# Patient Record
Sex: Female | Born: 1970 | Race: White | Hispanic: No | Marital: Married | Smoking: Never smoker
Health system: Southern US, Community
[De-identification: ages and names within clinical notes are randomized; demographics above are authoritative.]

## PROBLEM LIST (undated history)

## (undated) ENCOUNTER — Emergency Department (HOSPITAL_COMMUNITY): Payer: Self-pay

## (undated) DIAGNOSIS — I10 Essential (primary) hypertension: Secondary | ICD-10-CM

## (undated) DIAGNOSIS — G43909 Migraine, unspecified, not intractable, without status migrainosus: Secondary | ICD-10-CM

## (undated) DIAGNOSIS — G610 Guillain-Barre syndrome: Secondary | ICD-10-CM

## (undated) DIAGNOSIS — D569 Thalassemia, unspecified: Secondary | ICD-10-CM

## (undated) DIAGNOSIS — I429 Cardiomyopathy, unspecified: Secondary | ICD-10-CM

## (undated) DIAGNOSIS — D649 Anemia, unspecified: Secondary | ICD-10-CM

## (undated) HISTORY — PX: REDUCTION MAMMAPLASTY: SUR839

## (undated) HISTORY — PX: BREAST REDUCTION SURGERY: SHX8

## (undated) HISTORY — PX: TUBAL LIGATION: SHX77

## (undated) HISTORY — PX: ABDOMINAL HYSTERECTOMY: SHX81

## (undated) HISTORY — DX: Cardiomyopathy, unspecified: I42.9

---

## 2009-05-30 ENCOUNTER — Ambulatory Visit (HOSPITAL_COMMUNITY): Admission: RE | Admit: 2009-05-30 | Discharge: 2009-05-30 | Payer: Self-pay | Admitting: Specialist

## 2009-11-28 ENCOUNTER — Ambulatory Visit (HOSPITAL_BASED_OUTPATIENT_CLINIC_OR_DEPARTMENT_OTHER): Admission: RE | Admit: 2009-11-28 | Discharge: 2009-11-29 | Payer: Self-pay | Admitting: Specialist

## 2010-05-04 LAB — BASIC METABOLIC PANEL
CO2: 25 mEq/L (ref 19–32)
Calcium: 8.7 mg/dL (ref 8.4–10.5)
Creatinine, Ser: 0.81 mg/dL (ref 0.4–1.2)
GFR calc Af Amer: >60 mL/min (ref >60)
GFR calc non Af Amer: >60 mL/min (ref >60)
Glucose, Bld: 85 mg/dL (ref 70–99)
Sodium: 134 mEq/L — ABNORMAL LOW (ref 135–145)

## 2010-05-04 LAB — CBC
Hemoglobin: 7.8 g/dL — ABNORMAL LOW (ref 12.0–15.0)
MCH: 15.9 pg — ABNORMAL LOW (ref 26.0–34.0)
MCHC: 26.7 g/dL — ABNORMAL LOW (ref 30.0–36.0)
Platelets: 333 10*3/uL (ref 150–400)

## 2010-05-04 LAB — DIFFERENTIAL
Basophils Absolute: 0.1 10*3/uL (ref 0.0–0.1)
Eosinophils Absolute: 0.1 10*3/uL (ref 0.0–0.7)
Eosinophils Relative: 1 % (ref 0–5)
Lymphs Abs: 2.9 10*3/uL (ref 0.7–4.0)
Monocytes Absolute: 0.4 10*3/uL (ref 0.1–1.0)
Neutrophils Relative %: 43 % (ref 43–77)

## 2010-05-04 LAB — POCT HEMOGLOBIN-HEMACUE: Hemoglobin: 9.6 g/dL — ABNORMAL LOW (ref 12.0–15.0)

## 2010-05-10 LAB — POCT HEMOGLOBIN-HEMACUE: Hemoglobin: 7.6 g/dL — ABNORMAL LOW (ref 12.0–15.0)

## 2011-06-15 ENCOUNTER — Encounter (HOSPITAL_COMMUNITY): Payer: Self-pay | Admitting: Emergency Medicine

## 2011-06-15 ENCOUNTER — Emergency Department (HOSPITAL_COMMUNITY)
Admission: EM | Admit: 2011-06-15 | Discharge: 2011-06-15 | Disposition: A | Discharge status: Home / Self Care | Payer: No Typology Code available for payment source | Attending: Emergency Medicine | Admitting: Emergency Medicine

## 2011-06-15 DIAGNOSIS — S139XXA Sprain of joints and ligaments of unspecified parts of neck, initial encounter: Secondary | ICD-10-CM | POA: Insufficient documentation

## 2011-06-15 DIAGNOSIS — G44209 Tension-type headache, unspecified, not intractable: Secondary | ICD-10-CM

## 2011-06-15 DIAGNOSIS — M542 Cervicalgia: Secondary | ICD-10-CM | POA: Insufficient documentation

## 2011-06-15 DIAGNOSIS — S161XXA Strain of muscle, fascia and tendon at neck level, initial encounter: Secondary | ICD-10-CM

## 2011-06-15 HISTORY — DX: Anemia, unspecified: D64.9

## 2011-06-15 MED ORDER — CYCLOBENZAPRINE HCL 10 MG PO TABS: 10.0000 mg | ORAL_TABLET | Freq: Three times a day (TID) | ORAL | Status: AC | PRN

## 2011-06-15 MED ORDER — TRAMADOL HCL 50 MG PO TABS: 50.0000 mg | ORAL_TABLET | Freq: Four times a day (QID) | ORAL | Status: AC | PRN

## 2011-06-15 MED ORDER — IBUPROFEN 800 MG PO TABS
800.0000 mg | ORAL_TABLET | Freq: Once | ORAL | Status: AC
  Administered 2011-06-15: 800 mg via ORAL
  Filled 2011-06-15: qty 1

## 2011-06-15 MED ORDER — NAPROXEN 500 MG PO TABS: 500.0000 mg | ORAL_TABLET | Freq: Two times a day (BID) | ORAL | Status: AC

## 2011-06-15 MED ORDER — OXYCODONE-ACETAMINOPHEN 5-325 MG PO TABS
2.0000 | ORAL_TABLET | Freq: Once | ORAL | Status: AC
  Administered 2011-06-15: 2 via ORAL
  Filled 2011-06-15: qty 2
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
## 2011-06-15 NOTE — Discharge Instructions (Signed)
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
## 2013-03-25 ENCOUNTER — Emergency Department (HOSPITAL_BASED_OUTPATIENT_CLINIC_OR_DEPARTMENT_OTHER): Admission: EM | Admit: 2013-03-25 | Payer: BC Managed Care – PPO | Source: Home / Self Care

## 2016-03-01 DIAGNOSIS — Z1231 Encounter for screening mammogram for malignant neoplasm of breast: Secondary | ICD-10-CM | POA: Diagnosis not present

## 2016-03-01 DIAGNOSIS — H538 Other visual disturbances: Secondary | ICD-10-CM | POA: Diagnosis not present

## 2016-03-01 DIAGNOSIS — I10 Essential (primary) hypertension: Secondary | ICD-10-CM | POA: Diagnosis not present

## 2016-03-01 DIAGNOSIS — Z01118 Encounter for examination of ears and hearing with other abnormal findings: Secondary | ICD-10-CM | POA: Diagnosis not present

## 2016-03-01 DIAGNOSIS — Z113 Encounter for screening for infections with a predominantly sexual mode of transmission: Secondary | ICD-10-CM | POA: Diagnosis not present

## 2016-03-01 DIAGNOSIS — Z131 Encounter for screening for diabetes mellitus: Secondary | ICD-10-CM | POA: Diagnosis not present

## 2016-03-01 DIAGNOSIS — Z136 Encounter for screening for cardiovascular disorders: Secondary | ICD-10-CM | POA: Diagnosis not present

## 2016-03-01 DIAGNOSIS — Z0000 Encounter for general adult medical examination without abnormal findings: Secondary | ICD-10-CM | POA: Diagnosis not present

## 2016-03-01 DIAGNOSIS — B009 Herpesviral infection, unspecified: Secondary | ICD-10-CM | POA: Diagnosis not present

## 2016-03-15 DIAGNOSIS — I429 Cardiomyopathy, unspecified: Secondary | ICD-10-CM | POA: Diagnosis not present

## 2016-03-15 DIAGNOSIS — G43119 Migraine with aura, intractable, without status migrainosus: Secondary | ICD-10-CM | POA: Diagnosis not present

## 2016-03-15 DIAGNOSIS — Z1151 Encounter for screening for human papillomavirus (HPV): Secondary | ICD-10-CM | POA: Diagnosis not present

## 2016-03-15 DIAGNOSIS — R635 Abnormal weight gain: Secondary | ICD-10-CM | POA: Diagnosis not present

## 2016-03-15 DIAGNOSIS — R1031 Right lower quadrant pain: Secondary | ICD-10-CM | POA: Diagnosis not present

## 2016-03-15 DIAGNOSIS — Z01419 Encounter for gynecological examination (general) (routine) without abnormal findings: Secondary | ICD-10-CM | POA: Diagnosis not present

## 2016-03-15 DIAGNOSIS — Z124 Encounter for screening for malignant neoplasm of cervix: Secondary | ICD-10-CM | POA: Diagnosis not present

## 2016-03-15 DIAGNOSIS — Z6835 Body mass index (BMI) 35.0-35.9, adult: Secondary | ICD-10-CM | POA: Diagnosis not present

## 2016-03-25 DIAGNOSIS — J40 Bronchitis, not specified as acute or chronic: Secondary | ICD-10-CM | POA: Diagnosis not present

## 2016-03-25 DIAGNOSIS — Z6841 Body Mass Index (BMI) 40.0 and over, adult: Secondary | ICD-10-CM | POA: Diagnosis not present

## 2016-04-05 DIAGNOSIS — Z6841 Body Mass Index (BMI) 40.0 and over, adult: Secondary | ICD-10-CM | POA: Diagnosis not present

## 2016-04-05 DIAGNOSIS — Z90710 Acquired absence of both cervix and uterus: Secondary | ICD-10-CM | POA: Diagnosis not present

## 2016-04-05 DIAGNOSIS — R1031 Right lower quadrant pain: Secondary | ICD-10-CM | POA: Diagnosis not present

## 2016-04-05 DIAGNOSIS — R102 Pelvic and perineal pain: Secondary | ICD-10-CM | POA: Diagnosis not present

## 2016-04-06 DIAGNOSIS — Z6841 Body Mass Index (BMI) 40.0 and over, adult: Secondary | ICD-10-CM | POA: Diagnosis not present

## 2016-04-06 DIAGNOSIS — J209 Acute bronchitis, unspecified: Secondary | ICD-10-CM | POA: Diagnosis not present

## 2016-04-23 DIAGNOSIS — G43119 Migraine with aura, intractable, without status migrainosus: Secondary | ICD-10-CM | POA: Diagnosis not present

## 2016-04-23 DIAGNOSIS — I10 Essential (primary) hypertension: Secondary | ICD-10-CM | POA: Diagnosis not present

## 2016-04-23 DIAGNOSIS — I429 Cardiomyopathy, unspecified: Secondary | ICD-10-CM | POA: Diagnosis not present

## 2016-04-23 DIAGNOSIS — R05 Cough: Secondary | ICD-10-CM | POA: Diagnosis not present

## 2016-07-08 DIAGNOSIS — Z880 Allergy status to penicillin: Secondary | ICD-10-CM | POA: Diagnosis not present

## 2016-07-08 DIAGNOSIS — M79601 Pain in right arm: Secondary | ICD-10-CM | POA: Diagnosis not present

## 2016-07-08 DIAGNOSIS — M7591 Shoulder lesion, unspecified, right shoulder: Secondary | ICD-10-CM | POA: Diagnosis not present

## 2016-07-08 DIAGNOSIS — M25511 Pain in right shoulder: Secondary | ICD-10-CM | POA: Diagnosis not present

## 2016-07-08 DIAGNOSIS — M779 Enthesopathy, unspecified: Secondary | ICD-10-CM | POA: Diagnosis not present

## 2016-07-08 DIAGNOSIS — Z882 Allergy status to sulfonamides status: Secondary | ICD-10-CM | POA: Diagnosis not present

## 2016-08-30 DIAGNOSIS — I429 Cardiomyopathy, unspecified: Secondary | ICD-10-CM | POA: Diagnosis not present

## 2016-08-30 DIAGNOSIS — I10 Essential (primary) hypertension: Secondary | ICD-10-CM | POA: Diagnosis not present

## 2016-08-30 DIAGNOSIS — D649 Anemia, unspecified: Secondary | ICD-10-CM | POA: Diagnosis not present

## 2016-08-30 DIAGNOSIS — G43119 Migraine with aura, intractable, without status migrainosus: Secondary | ICD-10-CM | POA: Diagnosis not present

## 2016-11-05 DIAGNOSIS — J0141 Acute recurrent pansinusitis: Secondary | ICD-10-CM | POA: Diagnosis not present

## 2016-11-05 DIAGNOSIS — R079 Chest pain, unspecified: Secondary | ICD-10-CM | POA: Diagnosis not present

## 2016-11-05 DIAGNOSIS — J40 Bronchitis, not specified as acute or chronic: Secondary | ICD-10-CM | POA: Diagnosis not present

## 2016-11-05 DIAGNOSIS — J060 Acute laryngopharyngitis: Secondary | ICD-10-CM | POA: Diagnosis not present

## 2016-11-28 DIAGNOSIS — R05 Cough: Secondary | ICD-10-CM | POA: Diagnosis not present

## 2016-11-28 DIAGNOSIS — I429 Cardiomyopathy, unspecified: Secondary | ICD-10-CM | POA: Diagnosis not present

## 2016-11-28 DIAGNOSIS — I10 Essential (primary) hypertension: Secondary | ICD-10-CM | POA: Diagnosis not present

## 2016-11-28 DIAGNOSIS — D649 Anemia, unspecified: Secondary | ICD-10-CM | POA: Diagnosis not present

## 2017-01-07 DIAGNOSIS — F439 Reaction to severe stress, unspecified: Secondary | ICD-10-CM | POA: Diagnosis not present

## 2017-01-07 DIAGNOSIS — D649 Anemia, unspecified: Secondary | ICD-10-CM | POA: Diagnosis not present

## 2017-01-07 DIAGNOSIS — I10 Essential (primary) hypertension: Secondary | ICD-10-CM | POA: Diagnosis not present

## 2017-01-07 DIAGNOSIS — G43119 Migraine with aura, intractable, without status migrainosus: Secondary | ICD-10-CM | POA: Diagnosis not present

## 2017-01-21 DIAGNOSIS — I10 Essential (primary) hypertension: Secondary | ICD-10-CM | POA: Diagnosis not present

## 2017-01-21 DIAGNOSIS — D649 Anemia, unspecified: Secondary | ICD-10-CM | POA: Diagnosis not present

## 2017-01-21 DIAGNOSIS — R202 Paresthesia of skin: Secondary | ICD-10-CM | POA: Diagnosis not present

## 2017-01-21 DIAGNOSIS — F439 Reaction to severe stress, unspecified: Secondary | ICD-10-CM | POA: Diagnosis not present

## 2017-02-02 DIAGNOSIS — R202 Paresthesia of skin: Secondary | ICD-10-CM | POA: Diagnosis not present

## 2017-02-02 DIAGNOSIS — R51 Headache: Secondary | ICD-10-CM | POA: Diagnosis not present

## 2017-02-02 DIAGNOSIS — F4323 Adjustment disorder with mixed anxiety and depressed mood: Secondary | ICD-10-CM | POA: Diagnosis not present

## 2017-03-04 DIAGNOSIS — G43909 Migraine, unspecified, not intractable, without status migrainosus: Secondary | ICD-10-CM | POA: Diagnosis not present

## 2017-03-04 DIAGNOSIS — R202 Paresthesia of skin: Secondary | ICD-10-CM | POA: Diagnosis not present

## 2017-03-04 DIAGNOSIS — Z136 Encounter for screening for cardiovascular disorders: Secondary | ICD-10-CM | POA: Diagnosis not present

## 2017-03-04 DIAGNOSIS — D649 Anemia, unspecified: Secondary | ICD-10-CM | POA: Diagnosis not present

## 2017-03-04 DIAGNOSIS — Z131 Encounter for screening for diabetes mellitus: Secondary | ICD-10-CM | POA: Diagnosis not present

## 2017-03-04 DIAGNOSIS — I10 Essential (primary) hypertension: Secondary | ICD-10-CM | POA: Diagnosis not present

## 2017-03-04 DIAGNOSIS — Z01118 Encounter for examination of ears and hearing with other abnormal findings: Secondary | ICD-10-CM | POA: Diagnosis not present

## 2017-03-04 DIAGNOSIS — Z0000 Encounter for general adult medical examination without abnormal findings: Secondary | ICD-10-CM | POA: Diagnosis not present

## 2017-03-04 DIAGNOSIS — N390 Urinary tract infection, site not specified: Secondary | ICD-10-CM | POA: Diagnosis not present

## 2017-03-28 DIAGNOSIS — I10 Essential (primary) hypertension: Secondary | ICD-10-CM | POA: Diagnosis not present

## 2017-03-28 DIAGNOSIS — G43909 Migraine, unspecified, not intractable, without status migrainosus: Secondary | ICD-10-CM | POA: Diagnosis not present

## 2017-03-28 DIAGNOSIS — D649 Anemia, unspecified: Secondary | ICD-10-CM | POA: Diagnosis not present

## 2017-03-28 DIAGNOSIS — R202 Paresthesia of skin: Secondary | ICD-10-CM | POA: Diagnosis not present

## 2017-04-04 DIAGNOSIS — D649 Anemia, unspecified: Secondary | ICD-10-CM | POA: Diagnosis not present

## 2017-04-04 DIAGNOSIS — I10 Essential (primary) hypertension: Secondary | ICD-10-CM | POA: Diagnosis not present

## 2017-04-04 DIAGNOSIS — Z124 Encounter for screening for malignant neoplasm of cervix: Secondary | ICD-10-CM | POA: Diagnosis not present

## 2017-04-04 DIAGNOSIS — Z1231 Encounter for screening mammogram for malignant neoplasm of breast: Secondary | ICD-10-CM | POA: Diagnosis not present

## 2017-04-04 DIAGNOSIS — R300 Dysuria: Secondary | ICD-10-CM | POA: Diagnosis not present

## 2017-04-04 DIAGNOSIS — G43909 Migraine, unspecified, not intractable, without status migrainosus: Secondary | ICD-10-CM | POA: Diagnosis not present

## 2017-04-12 DIAGNOSIS — R0609 Other forms of dyspnea: Secondary | ICD-10-CM | POA: Diagnosis not present

## 2017-04-12 DIAGNOSIS — I10 Essential (primary) hypertension: Secondary | ICD-10-CM | POA: Diagnosis not present

## 2017-04-12 DIAGNOSIS — R9431 Abnormal electrocardiogram [ECG] [EKG]: Secondary | ICD-10-CM | POA: Diagnosis not present

## 2017-04-25 DIAGNOSIS — D649 Anemia, unspecified: Secondary | ICD-10-CM | POA: Diagnosis not present

## 2017-04-25 DIAGNOSIS — I10 Essential (primary) hypertension: Secondary | ICD-10-CM | POA: Diagnosis not present

## 2017-04-25 DIAGNOSIS — R202 Paresthesia of skin: Secondary | ICD-10-CM | POA: Diagnosis not present

## 2017-04-25 DIAGNOSIS — G43909 Migraine, unspecified, not intractable, without status migrainosus: Secondary | ICD-10-CM | POA: Diagnosis not present

## 2017-05-21 DIAGNOSIS — F419 Anxiety disorder, unspecified: Secondary | ICD-10-CM | POA: Diagnosis not present

## 2017-05-21 DIAGNOSIS — G43909 Migraine, unspecified, not intractable, without status migrainosus: Secondary | ICD-10-CM | POA: Diagnosis not present

## 2017-05-21 DIAGNOSIS — I10 Essential (primary) hypertension: Secondary | ICD-10-CM | POA: Diagnosis not present

## 2017-05-21 DIAGNOSIS — D649 Anemia, unspecified: Secondary | ICD-10-CM | POA: Diagnosis not present

## 2017-08-15 DIAGNOSIS — Z113 Encounter for screening for infections with a predominantly sexual mode of transmission: Secondary | ICD-10-CM | POA: Diagnosis not present

## 2017-08-15 DIAGNOSIS — B009 Herpesviral infection, unspecified: Secondary | ICD-10-CM | POA: Diagnosis not present

## 2017-08-15 DIAGNOSIS — Z131 Encounter for screening for diabetes mellitus: Secondary | ICD-10-CM | POA: Diagnosis not present

## 2017-08-15 DIAGNOSIS — Z136 Encounter for screening for cardiovascular disorders: Secondary | ICD-10-CM | POA: Diagnosis not present

## 2017-08-15 DIAGNOSIS — Z0000 Encounter for general adult medical examination without abnormal findings: Secondary | ICD-10-CM | POA: Diagnosis not present

## 2017-08-15 DIAGNOSIS — D649 Anemia, unspecified: Secondary | ICD-10-CM | POA: Diagnosis not present

## 2017-08-15 DIAGNOSIS — Z0110 Encounter for examination of ears and hearing without abnormal findings: Secondary | ICD-10-CM | POA: Diagnosis not present

## 2017-08-15 DIAGNOSIS — G43909 Migraine, unspecified, not intractable, without status migrainosus: Secondary | ICD-10-CM | POA: Diagnosis not present

## 2017-08-15 DIAGNOSIS — I10 Essential (primary) hypertension: Secondary | ICD-10-CM | POA: Diagnosis not present

## 2017-08-15 DIAGNOSIS — F419 Anxiety disorder, unspecified: Secondary | ICD-10-CM | POA: Diagnosis not present

## 2017-09-02 DIAGNOSIS — F419 Anxiety disorder, unspecified: Secondary | ICD-10-CM | POA: Diagnosis not present

## 2017-09-02 DIAGNOSIS — I10 Essential (primary) hypertension: Secondary | ICD-10-CM | POA: Diagnosis not present

## 2017-09-02 DIAGNOSIS — D649 Anemia, unspecified: Secondary | ICD-10-CM | POA: Diagnosis not present

## 2017-09-02 DIAGNOSIS — G43909 Migraine, unspecified, not intractable, without status migrainosus: Secondary | ICD-10-CM | POA: Diagnosis not present

## 2017-10-09 ENCOUNTER — Emergency Department (HOSPITAL_BASED_OUTPATIENT_CLINIC_OR_DEPARTMENT_OTHER): Payer: BLUE CROSS/BLUE SHIELD

## 2017-10-09 ENCOUNTER — Emergency Department (HOSPITAL_BASED_OUTPATIENT_CLINIC_OR_DEPARTMENT_OTHER)
Admission: EM | Admit: 2017-10-09 | Discharge: 2017-10-09 | Disposition: A | Discharge status: Home / Self Care | Payer: BLUE CROSS/BLUE SHIELD | Attending: Emergency Medicine | Admitting: Emergency Medicine

## 2017-10-09 ENCOUNTER — Other Ambulatory Visit: Payer: Self-pay

## 2017-10-09 ENCOUNTER — Encounter (HOSPITAL_BASED_OUTPATIENT_CLINIC_OR_DEPARTMENT_OTHER): Payer: Self-pay

## 2017-10-09 DIAGNOSIS — R0600 Dyspnea, unspecified: Secondary | ICD-10-CM | POA: Diagnosis not present

## 2017-10-09 DIAGNOSIS — R072 Precordial pain: Secondary | ICD-10-CM | POA: Diagnosis not present

## 2017-10-09 DIAGNOSIS — R0602 Shortness of breath: Secondary | ICD-10-CM | POA: Diagnosis not present

## 2017-10-09 DIAGNOSIS — R61 Generalized hyperhidrosis: Secondary | ICD-10-CM | POA: Insufficient documentation

## 2017-10-09 DIAGNOSIS — I10 Essential (primary) hypertension: Secondary | ICD-10-CM | POA: Diagnosis not present

## 2017-10-09 DIAGNOSIS — R079 Chest pain, unspecified: Secondary | ICD-10-CM | POA: Diagnosis not present

## 2017-10-09 HISTORY — DX: Thalassemia, unspecified: D56.9

## 2017-10-09 HISTORY — DX: Essential (primary) hypertension: I10

## 2017-10-09 LAB — BASIC METABOLIC PANEL
ANION GAP: 9 (ref 5–15)
BUN: 8 mg/dL (ref 6–20)
CALCIUM: 9.1 mg/dL (ref 8.9–10.3)
CO2: 25 mmol/L (ref 22–32)
CREATININE: 1.06 mg/dL — AB (ref 0.44–1.00)
Chloride: 104 mmol/L (ref 98–111)
GFR calc non Af Amer: >60 mL/min (ref >60)
GLUCOSE: 87 mg/dL (ref 70–99)
Potassium: 3.1 mmol/L — ABNORMAL LOW (ref 3.5–5.1)
Sodium: 138 mmol/L (ref 135–145)

## 2017-10-09 LAB — CBG MONITORING, ED: Glucose-Capillary: 87 mg/dL (ref 70–99)

## 2017-10-09 LAB — TROPONIN I
Troponin I: <0.03 ng/mL (ref <0.03)
Troponin I: <0.03 ng/mL (ref <0.03)

## 2017-10-09 LAB — CBC
HCT: 40 % (ref 36.0–46.0)
Hemoglobin: 12.8 g/dL (ref 12.0–15.0)
MCH: 23.8 pg — ABNORMAL LOW (ref 26.0–34.0)
MCHC: 32 g/dL (ref 30.0–36.0)
MCV: 74.3 fL — AB (ref 78.0–100.0)
Platelets: 236 10*3/uL (ref 150–400)
RBC: 5.38 MIL/uL — ABNORMAL HIGH (ref 3.87–5.11)
RDW: 15.6 % — AB (ref 11.5–15.5)
WBC: 6 10*3/uL (ref 4.0–10.5)

## 2017-10-09 LAB — PREGNANCY, URINE: Preg Test, Ur: NEGATIVE

## 2017-10-09 LAB — D-DIMER, QUANTITATIVE: D-Dimer, Quant: <0.27 ug/mL-FEU (ref 0.00–0.50)

## 2017-10-09 MED ORDER — ASPIRIN 81 MG PO CHEW
324.0000 mg | CHEWABLE_TABLET | Freq: Once | ORAL | Status: DC
Start: 2017-10-09 — End: 2017-10-09

## 2017-10-09 MED ORDER — ASPIRIN 81 MG PO CHEW
324.0000 mg | CHEWABLE_TABLET | Freq: Once | ORAL | Status: AC
  Administered 2017-10-09: 324 mg via ORAL
  Filled 2017-10-09: qty 4

## 2017-10-09 NOTE — ED Notes (Signed)
Pt describes chest pain as intermittent "twinges" of pain to the L side of sternum and states in went down L arm. Pain is reproducible to palpation. Pt endorses that she has had some associated ShOB.

## 2017-10-09 NOTE — ED Provider Notes (Signed)
Emergency Department Provider Note   I have reviewed the triage vital signs and the nursing notes.   HISTORY  Chief Complaint Chest Pain   HPI Kaitlyn Ashley is a 47 y.o. female with a history of thalassemia and reported history of hypertension but on medications and not hypertensive here the presents the emergency department today secondary to chest pain.  Patient has had intermittent chest pain for the last 24 to 36 hours.  Not related to effort but does have dyspnea on exertion during this time as well.  She also states she has dyspnea associated with the chest pain.  No nausea or vomiting.  Has had a couple episodes of diaphoresis but is not consistent with the chest pain.  She is tried antacids which did not seem to make her symptoms better.  She has not had any trauma with anything heavy recently.  No recent cough or fever.  No pleuritic component.  Does seem to radiate to her left shoulder little bit.  Sometimes is worse with palpation symptoms is not. No other associated or modifying symptoms.    Past Medical History:  Diagnosis Date  . Anemia   . Hypertension   . Thalassemia     There are no active problems to display for this patient.   Past Surgical History:  Procedure Laterality Date  . ABDOMINAL HYSTERECTOMY    . BREAST REDUCTION SURGERY    . CESAREAN SECTION      Current Outpatient Rx  . Order #: [id number] Class: Historical Med  . Order #: [id number] Class: Historical Med  . Order #: [id number] Class: Historical Med    Allergies Penicillins and Sulfa antibiotics  No family history on file.  Social History Social History   Tobacco Use  . Smoking status: Never Smoker  . Smokeless tobacco: Never Used  Substance Use Topics  . Alcohol use: Not Currently  . Drug use: Never    Review of Systems  All other systems negative except as documented in the HPI. All pertinent positives and negatives as reviewed in the  HPI. ____________________________________________   PHYSICAL EXAM:  VITAL SIGNS: ED Triage Vitals  Enc Vitals Group     BP 10/09/17 1711 125/86     Pulse Rate 10/09/17 1711 95     Resp 10/09/17 1711 20     Temp 10/09/17 1711 99 F (37.2 C)     Temp Source 10/09/17 1711 Oral     SpO2 10/09/17 1711 100 %     Weight 10/09/17 1711 223 lb 9.6 oz (101.4 kg)     Height 10/09/17 1711 5\' 4"  (1.626 m)     Head Circumference --      Peak Flow --      Pain Score 10/09/17 1717 8     Pain Loc --      Pain Edu? --      Excl. in GC? --     Constitutional: Alert and oriented. Well appearing and in no acute distress. Eyes: Conjunctivae are normal. PERRL. EOMI. Head: Atraumatic. Nose: No congestion/rhinnorhea. Mouth/Throat: Mucous membranes are moist.  Oropharynx non-erythematous. Neck: No stridor.  No meningeal signs.   Cardiovascular: Normal rate, regular rhythm. Good peripheral circulation. Grossly normal heart sounds.   Respiratory: Normal respiratory effort.  No retractions. Lungs CTAB. Gastrointestinal: Soft and nontender. No distention.  Musculoskeletal: No lower extremity tenderness nor edema. Does have some ttp to left costochondral joint. No gross deformities of extremities. Neurologic:  Normal speech and language. No gross focal neurologic deficits  are appreciated.  Skin:  Skin is warm, dry and intact. No rash noted.   ____________________________________________   LABS (all labs ordered are listed, but only abnormal results are displayed)  Labs Reviewed  BASIC METABOLIC PANEL - Abnormal; Notable for the following components:      Result Value   Potassium 3.1 (*)    Creatinine, Ser 1.06 (*)    All other components within normal limits  CBC - Abnormal; Notable for the following components:   RBC 5.38 (*)    MCV 74.3 (*)    MCH 23.8 (*)    RDW 15.6 (*)    All other components within normal limits  TROPONIN I  TROPONIN I  PREGNANCY, URINE  D-DIMER, QUANTITATIVE (NOT  [facility])  CBG MONITORING, ED   ____________________________________________  EKG   EKG Interpretation  Date/Time:  Wednesday October 09 2017 17:18:33 EDT Ventricular Rate:  71 PR Interval:  162 QRS Duration: 84 QT Interval:  398 QTC Calculation: 432 R Axis:   48 Text Interpretation:  Normal sinus rhythm Possible Anterior infarct , age undetermined Abnormal ECG No old tracing to compare Confirmed by Marily MemosMesner, Acey Woodfield [phone]([id number]) on 10/09/2017 5:21:55 PM       ____________________________________________  RADIOLOGY  Dg Chest 2 View  Result Date: 10/09/2017 CLINICAL DATA:  Chest pain and shortness of breath EXAM: CHEST - 2 VIEW COMPARISON:  November 05, 2016 FINDINGS: There is no edema or consolidation. The heart size and pulmonary vascularity are normal. No adenopathy. No pneumothorax. No bone lesions. IMPRESSION: No edema or consolidation. Electronically Signed   By: Bretta BangWilliam  Woodruff III M.D.   On: 10/09/2017 19:23    ____________________________________________   PROCEDURES  Procedure(s) performed:   Procedures   ____________________________________________   INITIAL IMPRESSION / ASSESSMENT AND PLAN / ED COURSE  eval for pe/acs. Seems somewhat MSK but doesn't explain the exertional dyspnea unless she also has anemia with it?  HEART score of 1. Workup unremarkable. Delta troponins negative.d dimer negative. Possibly MSK? CP free currently. Stable for dc.    Pertinent labs & imaging results that were available during my care of the patient were reviewed by me and considered in my medical decision making (see chart for details).  ____________________________________________  FINAL CLINICAL IMPRESSION(S) / ED DIAGNOSES  Final diagnoses:  Nonspecific chest pain     MEDICATIONS GIVEN DURING THIS VISIT:  Medications  aspirin chewable tablet 324 mg (324 mg Oral Given 10/09/17 1804)     NEW OUTPATIENT MEDICATIONS STARTED DURING THIS VISIT:  Discharge Medication  List as of 10/09/2017  9:14 PM      Note:  This note was prepared with assistance of Dragon voice recognition software. Occasional wrong-word or sound-a-like substitutions may have occurred due to the inherent limitations of voice recognition software.   Marily MemosMesner, Breindel Collier, MD 10/09/17 [phone]

## 2017-10-09 NOTE — ED Triage Notes (Signed)
C/o CP x 2 days-NAD-steady gait 

## 2018-03-03 DIAGNOSIS — Z1231 Encounter for screening mammogram for malignant neoplasm of breast: Secondary | ICD-10-CM | POA: Diagnosis not present

## 2018-03-03 DIAGNOSIS — Z1239 Encounter for other screening for malignant neoplasm of breast: Secondary | ICD-10-CM | POA: Diagnosis not present

## 2018-03-04 DIAGNOSIS — G43909 Migraine, unspecified, not intractable, without status migrainosus: Secondary | ICD-10-CM | POA: Diagnosis not present

## 2018-03-04 DIAGNOSIS — F419 Anxiety disorder, unspecified: Secondary | ICD-10-CM | POA: Diagnosis not present

## 2018-03-04 DIAGNOSIS — D649 Anemia, unspecified: Secondary | ICD-10-CM | POA: Diagnosis not present

## 2018-03-04 DIAGNOSIS — I10 Essential (primary) hypertension: Secondary | ICD-10-CM | POA: Diagnosis not present

## 2018-03-25 DIAGNOSIS — Z01419 Encounter for gynecological examination (general) (routine) without abnormal findings: Secondary | ICD-10-CM | POA: Diagnosis not present

## 2018-03-25 DIAGNOSIS — R102 Pelvic and perineal pain: Secondary | ICD-10-CM | POA: Diagnosis not present

## 2018-03-25 DIAGNOSIS — Z90710 Acquired absence of both cervix and uterus: Secondary | ICD-10-CM | POA: Diagnosis not present

## 2018-03-25 DIAGNOSIS — R1031 Right lower quadrant pain: Secondary | ICD-10-CM | POA: Diagnosis not present

## 2018-03-25 DIAGNOSIS — Z90711 Acquired absence of uterus with remaining cervical stump: Secondary | ICD-10-CM | POA: Diagnosis not present

## 2018-03-25 DIAGNOSIS — N83291 Other ovarian cyst, right side: Secondary | ICD-10-CM | POA: Diagnosis not present

## 2018-05-06 DIAGNOSIS — F419 Anxiety disorder, unspecified: Secondary | ICD-10-CM | POA: Diagnosis not present

## 2018-05-06 DIAGNOSIS — I10 Essential (primary) hypertension: Secondary | ICD-10-CM | POA: Diagnosis not present

## 2018-05-06 DIAGNOSIS — D649 Anemia, unspecified: Secondary | ICD-10-CM | POA: Diagnosis not present

## 2018-05-06 DIAGNOSIS — G43909 Migraine, unspecified, not intractable, without status migrainosus: Secondary | ICD-10-CM | POA: Diagnosis not present

## 2018-06-17 DIAGNOSIS — J40 Bronchitis, not specified as acute or chronic: Secondary | ICD-10-CM | POA: Diagnosis not present

## 2018-06-24 DIAGNOSIS — F419 Anxiety disorder, unspecified: Secondary | ICD-10-CM | POA: Diagnosis not present

## 2018-06-24 DIAGNOSIS — G43909 Migraine, unspecified, not intractable, without status migrainosus: Secondary | ICD-10-CM | POA: Diagnosis not present

## 2018-06-24 DIAGNOSIS — D649 Anemia, unspecified: Secondary | ICD-10-CM | POA: Diagnosis not present

## 2018-06-24 DIAGNOSIS — I10 Essential (primary) hypertension: Secondary | ICD-10-CM | POA: Diagnosis not present

## 2018-07-30 DIAGNOSIS — R1031 Right lower quadrant pain: Secondary | ICD-10-CM | POA: Diagnosis not present

## 2018-07-30 DIAGNOSIS — N83291 Other ovarian cyst, right side: Secondary | ICD-10-CM | POA: Diagnosis not present

## 2018-07-30 DIAGNOSIS — Z90710 Acquired absence of both cervix and uterus: Secondary | ICD-10-CM | POA: Diagnosis not present

## 2018-07-30 DIAGNOSIS — N83201 Unspecified ovarian cyst, right side: Secondary | ICD-10-CM | POA: Diagnosis not present

## 2018-08-05 DIAGNOSIS — I10 Essential (primary) hypertension: Secondary | ICD-10-CM | POA: Diagnosis not present

## 2018-08-05 DIAGNOSIS — G43909 Migraine, unspecified, not intractable, without status migrainosus: Secondary | ICD-10-CM | POA: Diagnosis not present

## 2018-08-05 DIAGNOSIS — F419 Anxiety disorder, unspecified: Secondary | ICD-10-CM | POA: Diagnosis not present

## 2018-08-05 DIAGNOSIS — D649 Anemia, unspecified: Secondary | ICD-10-CM | POA: Diagnosis not present

## 2018-09-02 DIAGNOSIS — Z01818 Encounter for other preprocedural examination: Secondary | ICD-10-CM | POA: Diagnosis not present

## 2018-09-02 DIAGNOSIS — N83201 Unspecified ovarian cyst, right side: Secondary | ICD-10-CM | POA: Diagnosis not present

## 2018-09-02 DIAGNOSIS — R1031 Right lower quadrant pain: Secondary | ICD-10-CM | POA: Diagnosis not present

## 2018-09-05 DIAGNOSIS — Z1159 Encounter for screening for other viral diseases: Secondary | ICD-10-CM | POA: Diagnosis not present

## 2018-09-05 DIAGNOSIS — Z01812 Encounter for preprocedural laboratory examination: Secondary | ICD-10-CM | POA: Diagnosis not present

## 2018-09-05 DIAGNOSIS — R1031 Right lower quadrant pain: Secondary | ICD-10-CM | POA: Diagnosis not present

## 2018-09-05 DIAGNOSIS — Z20828 Contact with and (suspected) exposure to other viral communicable diseases: Secondary | ICD-10-CM | POA: Diagnosis not present

## 2018-09-05 DIAGNOSIS — N83201 Unspecified ovarian cyst, right side: Secondary | ICD-10-CM | POA: Diagnosis not present

## 2018-09-12 DIAGNOSIS — N83201 Unspecified ovarian cyst, right side: Secondary | ICD-10-CM | POA: Diagnosis not present

## 2018-09-12 DIAGNOSIS — Z90711 Acquired absence of uterus with remaining cervical stump: Secondary | ICD-10-CM | POA: Diagnosis not present

## 2018-09-12 DIAGNOSIS — R1031 Right lower quadrant pain: Secondary | ICD-10-CM | POA: Diagnosis not present

## 2018-09-12 DIAGNOSIS — N736 Female pelvic peritoneal adhesions (postinfective): Secondary | ICD-10-CM | POA: Diagnosis not present

## 2018-09-13 DIAGNOSIS — N736 Female pelvic peritoneal adhesions (postinfective): Secondary | ICD-10-CM | POA: Diagnosis not present

## 2018-09-13 DIAGNOSIS — N83201 Unspecified ovarian cyst, right side: Secondary | ICD-10-CM | POA: Diagnosis not present

## 2018-10-15 DIAGNOSIS — G43909 Migraine, unspecified, not intractable, without status migrainosus: Secondary | ICD-10-CM | POA: Diagnosis not present

## 2018-10-16 DIAGNOSIS — I10 Essential (primary) hypertension: Secondary | ICD-10-CM | POA: Diagnosis not present

## 2018-10-16 DIAGNOSIS — F419 Anxiety disorder, unspecified: Secondary | ICD-10-CM | POA: Diagnosis not present

## 2018-10-16 DIAGNOSIS — D649 Anemia, unspecified: Secondary | ICD-10-CM | POA: Diagnosis not present

## 2018-10-16 DIAGNOSIS — G43909 Migraine, unspecified, not intractable, without status migrainosus: Secondary | ICD-10-CM | POA: Diagnosis not present

## 2018-12-29 ENCOUNTER — Other Ambulatory Visit: Payer: Self-pay | Admitting: Physician Assistant

## 2018-12-29 DIAGNOSIS — M25512 Pain in left shoulder: Secondary | ICD-10-CM | POA: Diagnosis not present

## 2018-12-29 DIAGNOSIS — G43909 Migraine, unspecified, not intractable, without status migrainosus: Secondary | ICD-10-CM | POA: Diagnosis not present

## 2018-12-29 DIAGNOSIS — N644 Mastodynia: Secondary | ICD-10-CM | POA: Diagnosis not present

## 2018-12-29 DIAGNOSIS — F419 Anxiety disorder, unspecified: Secondary | ICD-10-CM | POA: Diagnosis not present

## 2019-01-05 ENCOUNTER — Other Ambulatory Visit: Payer: Self-pay

## 2019-01-05 ENCOUNTER — Ambulatory Visit
Admission: RE | Admit: 2019-01-05 | Discharge: 2019-01-05 | Disposition: A | Discharge status: Home / Self Care | Payer: BLUE CROSS/BLUE SHIELD | Source: Ambulatory Visit | Attending: Physician Assistant | Admitting: Physician Assistant

## 2019-01-05 ENCOUNTER — Other Ambulatory Visit: Payer: Self-pay | Admitting: Physician Assistant

## 2019-01-05 ENCOUNTER — Ambulatory Visit
Admission: RE | Admit: 2019-01-05 | Discharge: 2019-01-05 | Disposition: A | Discharge status: Home / Self Care | Payer: BC Managed Care – PPO | Source: Ambulatory Visit | Attending: Physician Assistant | Admitting: Physician Assistant

## 2019-01-05 DIAGNOSIS — N6320 Unspecified lump in the left breast, unspecified quadrant: Secondary | ICD-10-CM | POA: Diagnosis not present

## 2019-01-05 DIAGNOSIS — N644 Mastodynia: Secondary | ICD-10-CM

## 2019-01-05 DIAGNOSIS — R928 Other abnormal and inconclusive findings on diagnostic imaging of breast: Secondary | ICD-10-CM | POA: Diagnosis not present

## 2019-01-05 DIAGNOSIS — N6489 Other specified disorders of breast: Secondary | ICD-10-CM | POA: Diagnosis not present

## 2019-03-19 DIAGNOSIS — I10 Essential (primary) hypertension: Secondary | ICD-10-CM | POA: Diagnosis not present

## 2019-03-19 DIAGNOSIS — G43909 Migraine, unspecified, not intractable, without status migrainosus: Secondary | ICD-10-CM | POA: Diagnosis not present

## 2019-03-19 DIAGNOSIS — F419 Anxiety disorder, unspecified: Secondary | ICD-10-CM | POA: Diagnosis not present

## 2019-03-19 DIAGNOSIS — D649 Anemia, unspecified: Secondary | ICD-10-CM | POA: Diagnosis not present

## 2019-04-02 DIAGNOSIS — Z03818 Encounter for observation for suspected exposure to other biological agents ruled out: Secondary | ICD-10-CM | POA: Diagnosis not present

## 2019-04-02 DIAGNOSIS — Z20828 Contact with and (suspected) exposure to other viral communicable diseases: Secondary | ICD-10-CM | POA: Diagnosis not present

## 2019-04-02 DIAGNOSIS — R030 Elevated blood-pressure reading, without diagnosis of hypertension: Secondary | ICD-10-CM | POA: Diagnosis not present

## 2019-04-02 DIAGNOSIS — R05 Cough: Secondary | ICD-10-CM | POA: Diagnosis not present

## 2019-04-30 DIAGNOSIS — G43909 Migraine, unspecified, not intractable, without status migrainosus: Secondary | ICD-10-CM | POA: Diagnosis not present

## 2019-04-30 DIAGNOSIS — F419 Anxiety disorder, unspecified: Secondary | ICD-10-CM | POA: Diagnosis not present

## 2019-04-30 DIAGNOSIS — I10 Essential (primary) hypertension: Secondary | ICD-10-CM | POA: Diagnosis not present

## 2019-04-30 DIAGNOSIS — J208 Acute bronchitis due to other specified organisms: Secondary | ICD-10-CM | POA: Diagnosis not present

## 2019-05-16 DIAGNOSIS — Z131 Encounter for screening for diabetes mellitus: Secondary | ICD-10-CM | POA: Diagnosis not present

## 2019-05-16 DIAGNOSIS — R0602 Shortness of breath: Secondary | ICD-10-CM | POA: Diagnosis not present

## 2019-05-16 DIAGNOSIS — R5383 Other fatigue: Secondary | ICD-10-CM | POA: Diagnosis not present

## 2019-05-16 DIAGNOSIS — Z79899 Other long term (current) drug therapy: Secondary | ICD-10-CM | POA: Diagnosis not present

## 2019-05-29 DIAGNOSIS — Z23 Encounter for immunization: Secondary | ICD-10-CM | POA: Diagnosis not present

## 2019-06-07 ENCOUNTER — Encounter (HOSPITAL_BASED_OUTPATIENT_CLINIC_OR_DEPARTMENT_OTHER): Payer: Self-pay | Admitting: *Deleted

## 2019-06-07 ENCOUNTER — Inpatient Hospital Stay (HOSPITAL_BASED_OUTPATIENT_CLINIC_OR_DEPARTMENT_OTHER)
Admission: EM | Admit: 2019-06-07 | Discharge: 2019-06-13 | DRG: 095 | Disposition: A | Discharge status: Home / Self Care | Payer: BC Managed Care – PPO | Attending: Internal Medicine | Admitting: Internal Medicine

## 2019-06-07 ENCOUNTER — Emergency Department (HOSPITAL_BASED_OUTPATIENT_CLINIC_OR_DEPARTMENT_OTHER): Payer: BC Managed Care – PPO

## 2019-06-07 ENCOUNTER — Other Ambulatory Visit: Payer: Self-pay

## 2019-06-07 DIAGNOSIS — E876 Hypokalemia: Secondary | ICD-10-CM | POA: Diagnosis not present

## 2019-06-07 DIAGNOSIS — D569 Thalassemia, unspecified: Secondary | ICD-10-CM | POA: Diagnosis present

## 2019-06-07 DIAGNOSIS — G610 Guillain-Barre syndrome: Principal | ICD-10-CM | POA: Diagnosis present

## 2019-06-07 DIAGNOSIS — M7989 Other specified soft tissue disorders: Secondary | ICD-10-CM | POA: Diagnosis not present

## 2019-06-07 DIAGNOSIS — Z6841 Body Mass Index (BMI) 40.0 and over, adult: Secondary | ICD-10-CM

## 2019-06-07 DIAGNOSIS — Z90710 Acquired absence of both cervix and uterus: Secondary | ICD-10-CM | POA: Diagnosis not present

## 2019-06-07 DIAGNOSIS — M79605 Pain in left leg: Secondary | ICD-10-CM | POA: Diagnosis not present

## 2019-06-07 DIAGNOSIS — Z91040 Latex allergy status: Secondary | ICD-10-CM | POA: Diagnosis not present

## 2019-06-07 DIAGNOSIS — Z8249 Family history of ischemic heart disease and other diseases of the circulatory system: Secondary | ICD-10-CM | POA: Diagnosis not present

## 2019-06-07 DIAGNOSIS — Z880 Allergy status to penicillin: Secondary | ICD-10-CM

## 2019-06-07 DIAGNOSIS — R200 Anesthesia of skin: Secondary | ICD-10-CM | POA: Diagnosis not present

## 2019-06-07 DIAGNOSIS — T50B95A Adverse effect of other viral vaccines, initial encounter: Secondary | ICD-10-CM | POA: Diagnosis present

## 2019-06-07 DIAGNOSIS — Z20822 Contact with and (suspected) exposure to covid-19: Secondary | ICD-10-CM | POA: Diagnosis present

## 2019-06-07 DIAGNOSIS — Z20828 Contact with and (suspected) exposure to other viral communicable diseases: Secondary | ICD-10-CM | POA: Diagnosis not present

## 2019-06-07 DIAGNOSIS — R202 Paresthesia of skin: Secondary | ICD-10-CM | POA: Diagnosis not present

## 2019-06-07 DIAGNOSIS — Z882 Allergy status to sulfonamides status: Secondary | ICD-10-CM

## 2019-06-07 DIAGNOSIS — G43909 Migraine, unspecified, not intractable, without status migrainosus: Secondary | ICD-10-CM | POA: Diagnosis not present

## 2019-06-07 DIAGNOSIS — M4802 Spinal stenosis, cervical region: Secondary | ICD-10-CM | POA: Diagnosis present

## 2019-06-07 DIAGNOSIS — Z9049 Acquired absence of other specified parts of digestive tract: Secondary | ICD-10-CM | POA: Diagnosis not present

## 2019-06-07 DIAGNOSIS — I10 Essential (primary) hypertension: Secondary | ICD-10-CM | POA: Diagnosis present

## 2019-06-07 DIAGNOSIS — M50222 Other cervical disc displacement at C5-C6 level: Secondary | ICD-10-CM | POA: Diagnosis not present

## 2019-06-07 DIAGNOSIS — M50221 Other cervical disc displacement at C4-C5 level: Secondary | ICD-10-CM | POA: Diagnosis not present

## 2019-06-07 DIAGNOSIS — M79604 Pain in right leg: Secondary | ICD-10-CM

## 2019-06-07 DIAGNOSIS — Z03818 Encounter for observation for suspected exposure to other biological agents ruled out: Secondary | ICD-10-CM | POA: Diagnosis not present

## 2019-06-07 DIAGNOSIS — M48061 Spinal stenosis, lumbar region without neurogenic claudication: Secondary | ICD-10-CM | POA: Diagnosis not present

## 2019-06-07 DIAGNOSIS — R519 Headache, unspecified: Secondary | ICD-10-CM | POA: Diagnosis not present

## 2019-06-07 HISTORY — DX: Morbid (severe) obesity due to excess calories: E66.01

## 2019-06-07 HISTORY — DX: Migraine, unspecified, not intractable, without status migrainosus: G43.909

## 2019-06-07 LAB — CBC
HCT: 45.9 % (ref 36.0–46.0)
Hemoglobin: 14.2 g/dL (ref 12.0–15.0)
MCH: 24.4 pg — ABNORMAL LOW (ref 26.0–34.0)
MCHC: 30.9 g/dL (ref 30.0–36.0)
MCV: 79 fL — ABNORMAL LOW (ref 80.0–100.0)
Platelets: 230 10*3/uL (ref 150–400)
RBC: 5.81 MIL/uL — ABNORMAL HIGH (ref 3.87–5.11)
RDW: 14.2 % (ref 11.5–15.5)
WBC: 5.7 10*3/uL (ref 4.0–10.5)
nRBC: 0 % (ref 0.0–0.2)

## 2019-06-07 LAB — URINALYSIS, ROUTINE W REFLEX MICROSCOPIC
Bilirubin Urine: NEGATIVE
Glucose, UA: NEGATIVE mg/dL
Hgb urine dipstick: NEGATIVE
Ketones, ur: NEGATIVE mg/dL
Leukocytes,Ua: NEGATIVE
Nitrite: NEGATIVE
Protein, ur: NEGATIVE mg/dL
Specific Gravity, Urine: 1.025 (ref 1.005–1.030)
pH: 5.5 (ref 5.0–8.0)

## 2019-06-07 LAB — BASIC METABOLIC PANEL
Anion gap: 10 (ref 5–15)
BUN: 12 mg/dL (ref 6–20)
CO2: 27 mmol/L (ref 22–32)
Calcium: 9 mg/dL (ref 8.9–10.3)
Chloride: 101 mmol/L (ref 98–111)
Creatinine, Ser: 1.05 mg/dL — ABNORMAL HIGH (ref 0.44–1.00)
GFR calc Af Amer: >60 mL/min (ref >60)
GFR calc non Af Amer: >60 mL/min (ref >60)
Glucose, Bld: 95 mg/dL (ref 70–99)
Potassium: 3.2 mmol/L — ABNORMAL LOW (ref 3.5–5.1)
Sodium: 138 mmol/L (ref 135–145)

## 2019-06-07 LAB — SEDIMENTATION RATE: Sed Rate: 18 mm/hr (ref 0–22)

## 2019-06-07 LAB — MAGNESIUM: Magnesium: 2.1 mg/dL (ref 1.7–2.4)

## 2019-06-07 MED ORDER — GADOBUTROL 1 MMOL/ML IV SOLN
7.5000 mL | Freq: Once | INTRAVENOUS | Status: AC | PRN
  Administered 2019-06-07: 7.5 mL via INTRAVENOUS

## 2019-06-07 MED ORDER — POTASSIUM CHLORIDE CRYS ER 20 MEQ PO TBCR
40.0000 meq | EXTENDED_RELEASE_TABLET | Freq: Once | ORAL | Status: AC
  Administered 2019-06-07: 40 meq via ORAL
  Filled 2019-06-07: qty 2

## 2019-06-07 NOTE — ED Notes (Signed)
Pt transported to MRI 

## 2019-06-07 NOTE — ED Notes (Signed)
Patient transported to CT 

## 2019-06-07 NOTE — ED Notes (Signed)
Pt returned from MRI °

## 2019-06-07 NOTE — ED Notes (Signed)
Spoke with Kaitlyn Ashley at [facility] regarding consult with neuro-hospitalist

## 2019-06-07 NOTE — ED Notes (Signed)
Sprite provided to pt 

## 2019-06-07 NOTE — ED Provider Notes (Signed)
[facility] EMERGENCY DEPARTMENT Provider Note   CSN: [id number] Arrival date & time: 06/07/19  1324     History Chief Complaint  Patient presents with  . Numbness    Kaitlyn Ashley is a 49 y.o. female.  Kaitlyn Ashley is a 49 y.o. female with a history of hypertension, anemia and thalassemia, who presents to the emergency department for evaluation of leg pain, shaking and tingling.  She states that on Monday when she woke up in the morning to get ready for work she noticed that her legs were painful and throbbing from the knee down to the foot.  She reports that she also noticed that her legs felt shaky when she tried to get up and go to the bathroom.  And she started to have a tingling pins and needle sensation.  She reports the symptoms have been persistent throughout the week.  She has been able to walk and has not noticed weakness in her legs, but the discomfort and tingling has kept her from doing everything she would typically do during the week.  She has tried ibuprofen, Tylenol, Aleve, and gabapentin for this pain without relief.  She states on Friday she started to notice similar pain and tingling starting in her left hand and going up towards the elbow, but no similar symptoms in the right arm.  She states that a few years ago she had some tingling in the right foot that they thought was neuropathy, which resolved, aside from this she has never had any similar symptoms.  Is not diabetic.  States that she has had some mild midthoracic back pain, but denies any trauma or injury to the back.  No loss of bowel or bladder control or saddle anesthesia.  Has chronic migraines which are unchanged but no new or different headaches, no visual changes.  No fevers or chills.  States she was initially seen at urgent care and had a negative rapid Covid test, but was sent to the emergency department for further evaluation.        Past Medical History:  Diagnosis Date  . Anemia   .  Hypertension   . Thalassemia     There are no problems to display for this patient.   Past Surgical History:  Procedure Laterality Date  . ABDOMINAL HYSTERECTOMY    . BREAST REDUCTION SURGERY    . CESAREAN SECTION       OB History   No obstetric history on file.     No family history on file.  Social History   Tobacco Use  . Smoking status: Never Smoker  . Smokeless tobacco: Never Used  Substance Use Topics  . Alcohol use: Not Currently  . Drug use: Never    Home Medications Prior to Admission medications   Medication Sig Start Date End Date Taking? Authorizing Provider  acetaminophen (TYLENOL) 500 MG tablet Take 1,000 mg by mouth every 6 (six) hours as needed. For pain.    [provider]  BEE POLLEN PO Take 1 tablet by mouth every other day.    [provider]  RASPBERRY PO Take 2 tablets by mouth every other day.    [provider]    Allergies    Latex, Penicillins, and Sulfa antibiotics  Review of Systems   Review of Systems  Constitutional: Negative for chills and fever.  Respiratory: Negative for cough and shortness of breath.   Cardiovascular: Negative for chest pain.  Gastrointestinal: Negative for abdominal pain,  nausea and vomiting.  Genitourinary: Negative for dysuria and frequency.  Musculoskeletal: Positive for arthralgias, back pain and myalgias. Negative for joint swelling and neck pain.  Skin: Negative for color change and rash.  Neurological: Positive for numbness. Negative for dizziness, seizures, syncope, speech difficulty, weakness, light-headedness and headaches.       Paresthesias    Physical Exam Updated Vital Signs BP 137/86 (BP Location: Right Arm)   Pulse 86   Temp 98.5 F (36.9 C) (Oral)   Resp 18   Ht 5' 4"  (1.626 m)   Wt 112.5 kg   SpO2 100%   BMI 42.57 kg/m   Physical Exam Vitals and nursing note reviewed.  Constitutional:      General: She is not in acute distress.    Appearance: Normal  appearance. She is well-developed. She is obese. She is not ill-appearing or diaphoretic.     Comments: Well-appearing and in no distress.  HENT:     Head: Normocephalic and atraumatic.  Eyes:     General:        Right eye: No discharge.        Left eye: No discharge.  Cardiovascular:     Rate and Rhythm: Normal rate and regular rhythm.     Heart sounds: Normal heart sounds. No murmur. No friction rub. No gallop.   Pulmonary:     Effort: Pulmonary effort is normal. No respiratory distress.     Breath sounds: Normal breath sounds. No wheezing or rales.     Comments: Respirations equal and unlabored, patient able to speak in full sentences, lungs clear to auscultation bilaterally Abdominal:     General: Bowel sounds are normal. There is no distension.     Palpations: Abdomen is soft. There is no mass.     Tenderness: There is no abdominal tenderness. There is no guarding.     Comments: Abdomen soft, nondistended, nontender to palpation in all quadrants without guarding or peritoneal signs  Musculoskeletal:        General: No deformity.     Cervical back: Neck supple.  Skin:    General: Skin is warm and dry.     Capillary Refill: Capillary refill takes less than 2 seconds.  Neurological:     Mental Status: She is alert.     Coordination: Coordination normal.     Comments: Speech is clear, able to follow commands CN III-XII intact Normal strength in upper and lower extremities bilaterally including dorsiflexion and plantar flexion, strong and equal grip strength DTRs intact bilaterally Sensation normal to light and sharp touch, patient reports pins and needle sensation with touch over bilateral lower extremities from the knee to the foot, and in the left arm from the hand to elbow. Moves extremities without ataxia, coordination intact  Psychiatric:        Behavior: Behavior normal.     ED Results / Procedures / Treatments   Labs (all labs ordered are listed, but only abnormal  results are displayed) Labs Reviewed  CBC - Abnormal; Notable for the following components:      Result Value   RBC 5.81 (*)    MCV 79.0 (*)    MCH 24.4 (*)    All other components within normal limits  BASIC METABOLIC PANEL - Abnormal; Notable for the following components:   Potassium 3.2 (*)    Creatinine, Ser 1.05 (*)    All other components within normal limits  SARS CORONAVIRUS 2 (TAT 6-24 HRS)  MAGNESIUM  URINALYSIS,  ROUTINE W REFLEX MICROSCOPIC  SEDIMENTATION RATE  C-REACTIVE PROTEIN  ANTINUCLEAR ANTIBODIES, IFA  TSH  VITAMIN B12  FOLATE  ANGIOTENSIN CONVERTING ENZYME  COPPER, SERUM    EKG None  Radiology CT Head Wo Contrast  Result Date: 06/07/2019 CLINICAL DATA:  Paresthesias in bilateral lower extremities for 1 week, now LEFT arm. Slight headache since this morning. EXAM: CT HEAD WITHOUT CONTRAST TECHNIQUE: Contiguous axial images were obtained from the base of the skull through the vertex without intravenous contrast. COMPARISON:  None. FINDINGS: Brain: Ventricles are normal in size. All areas of the brain demonstrate appropriate gray-Meddings matter attenuation. There is no mass, hemorrhage, edema or other evidence of acute parenchymal abnormality. No extra-axial hemorrhage. Vascular: No hyperdense vessel or unexpected calcification. Skull: Normal. Negative for fracture or focal lesion. Sinuses/Orbits: No acute finding. Other: None. IMPRESSION: Negative head CT. No intracranial mass, hemorrhage or edema. Electronically Signed   By: Franki Cabot M.D.   On: 06/07/2019 14:56   MR Cervical Spine W or Wo Contrast  Result Date: 06/07/2019 CLINICAL DATA:  Left arm tingling with bilateral lower extremity swelling EXAM: MRI CERVICAL AND THORACIC WITHOUT AND WITH CONTRAST TECHNIQUE: Multiplanar and multiecho pulse sequences of the cervical and thoracic spine were obtained without and with intravenous contrast. CONTRAST:  7.42m GADAVIST GADOBUTROL 1 MMOL/ML IV SOLN COMPARISON:   None. FINDINGS: MRI CERVICAL SPINE FINDINGS Alignment: Straightening of normal cervical lordosis. Normal alignment. Vertebrae: No fracture, evidence of discitis, or bone lesion. Cord:  Normal signal and morphology. Paraspinal and other soft tissues: Negative Disc levels: C1-C2: Unremarkable. C2-C3: Left uncovertebral hypertrophy with mild disc bulge. There is no spinal canal stenosis. Mild left neural foraminal stenosis. C3-C4: Disc bulge with bilateral uncovertebral hypertrophy. Mild spinal canal stenosis. Moderate bilateral neural foraminal stenosis. C4-C5: There is an intermediate disc bulge with bilateral uncovertebral hypertrophy. Small left subarticular protrusion. Moderate spinal canal stenosis. Severe right and moderate left neural foraminal stenosis. C5-C6: Disc bulge with uncovertebral hypertrophy. Mild spinal canal stenosis. Moderate bilateral neural foraminal stenosis. C6-C7: Disc bulge with uncovertebral hypertrophy, right worse than left. Mild spinal canal stenosis. Severe right and mild left neural foraminal stenosis. C7-T1: Normal disc space and facet joints. There is no spinal canal stenosis. No neural foraminal stenosis. MRI THORACIC SPINE FINDINGS Alignment: Normal Vertebrae: Normal Spinal cord: Normal Paraspinal soft tissues: Negative Disc levels: No disc herniation or stenosis. IMPRESSION: 1. No acute abnormality of the cervical or thoracic spine. 2. 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 3. Multilevel moderate to severe cervical neural foraminal stenosis. 4. Normal MRI of the thoracic spine. Electronically Signed   By: KUlyses JarredM.D.   On: 06/07/2019 19:16   MR THORACIC SPINE W WO CONTRAST  Result Date: 06/07/2019 CLINICAL DATA:  Left arm tingling with bilateral lower extremity swelling EXAM: MRI CERVICAL AND THORACIC WITHOUT AND WITH CONTRAST TECHNIQUE: Multiplanar and multiecho pulse sequences of the cervical and thoracic spine were obtained  without and with intravenous contrast. CONTRAST:  7.523mGADAVIST GADOBUTROL 1 MMOL/ML IV SOLN COMPARISON:  None. FINDINGS: MRI CERVICAL SPINE FINDINGS Alignment: Straightening of normal cervical lordosis. Normal alignment. Vertebrae: No fracture, evidence of discitis, or bone lesion. Cord:  Normal signal and morphology. Paraspinal and other soft tissues: Negative Disc levels: C1-C2: Unremarkable. C2-C3: Left uncovertebral hypertrophy with mild disc bulge. There is no spinal canal stenosis. Mild left neural foraminal stenosis. C3-C4: Disc bulge with bilateral uncovertebral hypertrophy. Mild spinal canal stenosis. Moderate bilateral neural foraminal stenosis. C4-C5: There is  an intermediate disc bulge with bilateral uncovertebral hypertrophy. Small left subarticular protrusion. Moderate spinal canal stenosis. Severe right and moderate left neural foraminal stenosis. C5-C6: Disc bulge with uncovertebral hypertrophy. Mild spinal canal stenosis. Moderate bilateral neural foraminal stenosis. C6-C7: Disc bulge with uncovertebral hypertrophy, right worse than left. Mild spinal canal stenosis. Severe right and mild left neural foraminal stenosis. C7-T1: Normal disc space and facet joints. There is no spinal canal stenosis. No neural foraminal stenosis. MRI THORACIC SPINE FINDINGS Alignment: Normal Vertebrae: Normal Spinal cord: Normal Paraspinal soft tissues: Negative Disc levels: No disc herniation or stenosis. IMPRESSION: 1. No acute abnormality of the cervical or thoracic spine. 2. 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 3. Multilevel moderate to severe cervical neural foraminal stenosis. 4. Normal MRI of the thoracic spine. Electronically Signed   By: Ulyses Jarred M.D.   On: 06/07/2019 19:16    Procedures Procedures (including critical care time)  Medications Ordered in ED Medications  potassium chloride SA (KLOR-CON) CR tablet 40 mEq (40 mEq Oral Given 06/07/19 1600)   gadobutrol (GADAVIST) 1 MMOL/ML injection 7.5 mL (7.5 mLs Intravenous Contrast Given 06/07/19 1830)    ED Course  I have reviewed the triage vital signs and the nursing notes.  Pertinent labs & imaging results that were available during my care of the patient were reviewed by me and considered in my medical decision making (see chart for details).    MDM Rules/Calculators/A&P                     49 year old female presenting with 1 week of pain and paresthesias in bilateral lower legs, 3 days ago symptoms progressed to the left arm and hand, none in the right.  Has some mild mid thoracic back pain, but no traumatic injury.  Unclear etiology for patient's paresthesias but these were acute in onset, could be potential electrolyte derangement, B12 deficiency, versus spinal lesion or myelopathy, will get basic labs and CT head, and get teleneurology consult.  I have ordered, reviewed and interpreted labs and imaging.  Lab work shows mild hypokalemia, no other significant electrolyte derangements, no leukocytosis and normal hemoglobin.  Head CT is unremarkable.  Dr. Samuel Germany with teleneurology has completed evaluation of patient and reports concern for potential noncompressive myelopathy versus transverse myelitis recommends MRI with and without contrast of the cervical and thoracic spine, as well as ESR, CRP, ANA, TSH, B12, folate, and ACE.  Recommends that the patient be admitted for further work-up.  I have ordered further work-up per neurology's recommendations.  Case discussed with Dr. Leonel Ramsay with neurology at [facility] for likely admission, he agrees with work-up but requests that we wait until MRIs return to ensure patient is not a surgical patient prior to medicine admission with neurology consultation.  MRI show no acute abnormality of the cervical or thoracic spine.  There is multilevel moderate to severe cervical canal stenosis worst at C4-C5 but with no abnormal cord signal.  Multilevel  moderate to severe cervical neural foraminal stenosis is also noted.  Normal MRI of the thoracic spine.  These findings do not seem to explain the distribution of patient's symptoms.  I discussed MRI results with Dr. Cheral Marker with neurology at [facility], he recommends getting MRI of the brain with and without contrast as well as well as adding a copper level onto patient's lab evaluation for possible myelopathy.  Agrees with medicine admission, and neurology will see the patient in consult once they  are transferred to [facility].  Request that hospitalist page neurology team once patient has arrived.  I discussed this plan with the patient who is in agreement.  Medicine team consulted for admission.  Case discussed with Dr. Hal Hope who accepts patient in transfer to [facility] and will see and admit the patient.   Final Clinical Impression(s) / ED Diagnoses Final diagnoses:  Paresthesia  Pain in both lower extremities    Rx / DC Orders ED Discharge Orders    None       Janet Berlin 06/08/19 0015    Lennice Sites, DO 06/10/19 1724

## 2019-06-07 NOTE — Consult Note (Signed)
[facility] Tele[facility] Consult Services  Stat Consult  Date of Service:   06/07/2019 14:54:29  Impression:     .  R20.2 - Paresthesia of skin  Comments/Sign-Out: 49yo woman w/PMH of HTN, thalassemia, anemia, migraines, peripheral neuropathy? p/w b/l LE tingling/throbbing pain, left arm tingling on 06/07/19. On 4/12, she developed b/l LE tingling/throbbing pain from her toes to her knees which has persisted. On 4/16, she also developed tingling in her left hand up to her elbow. No symptoms in her right arm. She also had brief imbalance yesterday. She's had intermittent tremulousness while walking during the week. She has a bruise on right leg today. No relief with Aleve, Tylenol, Gabapentin. She's had headaches but no changes from baseline. She had bronchitis from January-March. She got 1st Pfizer vaccine 05/29/19. She denies weakness, vision or speech changes, dysphagia, SOB, incontinence, prior episodes, trauma. Neuro exam shows mild altered sensation in left arm, otherwise intact. CTH unremarkable. Her presentation is concerning for transverse myelitis or non-compressive myelopathy, further workup advised.  CT HEAD: Showed No Acute Hemorrhage or Acute Core Infarct  Metrics: [facility] Notification Time: 06/07/2019 14:50:12 Stamp Time: 06/07/2019 14:54:29 Callback Response Time: 06/07/2019 14:55:02  Our recommendations are outlined below.  Recommendations:     Marland Kitchen  MRI C/T Spine WITH and without contrast to rule out acute demyelination     .  ESR, CRP, ANA, TSH, B12, Folate, ACE   Therapies:     .  Physical Therapy     .  Occupational Therapy  Other WorkUp:     .  Infectious/metabolic workup per primary team  Disposition: Neurology Follow Up Recommended  Sign Out:     .  Discussed with Emergency Department Provider  ----------------------------------------------------------------------------------------------------  Chief Complaint: b/l LE  tingling/throbbing pain, left arm tingling  History of Present Illness: Patient is a 49 year old Female.  49yo woman w/PMH of HTN, thalassemia, anemia, migraines, peripheral neuropathy? p/w b/l LE tingling/throbbing pain, left arm tingling on 06/07/19. On 4/12, she developed b/l LE tingling/throbbing pain from her toes to her knees which has persisted. On 4/16, she also developed tingling in her left hand up to her elbow. No symptoms in her right arm. She also had brief imbalance yesterday. She's had intermittent tremulousness while walking during the week. She has a bruise on right leg today. No relief with Aleve, Tylenol, Gabapentin. She's had headaches but no changes from baseline. She had bronchitis from January-March. She got 1st Pfizer vaccine 05/29/19. She denies weakness, vision or speech changes, dysphagia, SOB, incontinence, prior episodes, trauma.   Past Medical History:     . Hypertension     . There is NO history of Diabetes Mellitus     . There is NO history of Hyperlipidemia     . There is NO history of Atrial Fibrillation     . There is NO history of Coronary Artery Disease     . There is NO history of Stroke  Anticoagulant use:  No  Antiplatelet use: No    Examination: BP(137/86), Pulse(86), Blood Glucose(95)  Neuro Exam:  General: Alert,Awake, Oriented to Time, Place, Person  Speech: Fluent:  Language: Intact:  Face: Symmetric:  Facial Sensation: Intact:  Visual Fields: Intact:  Extraocular Movements: Intact:  Motor Exam: No Drift:  Sensation: Intact:  Coordination: Intact:  mild altered sensation in left arm   Patient/Family was informed the Neurology Consult would occur via TeleHealth consult by way of interactive audio and video telecommunications and consented to receiving  care in this manner.  Due to the immediate potential for life-threatening deterioration due to underlying acute neurologic illness, I spent 35 minutes providing critical care.  This time includes time for face to face visit via telemedicine, review of medical records, imaging studies and discussion of findings with providers, the patient and/or family.   Dr Lenard Galloway Damari Hiltz   [facility] [phone]  Case [id number]

## 2019-06-07 NOTE — ED Notes (Signed)
ED Provider at bedside. 

## 2019-06-07 NOTE — ED Triage Notes (Signed)
Pt reports one week of both legs intermittently shaking, numb, throbbing, tinging. For the last 2 days she has had similar Sx in her left hand. She has taken. Aleve, tylenol, gabapentin, and ibuprofen without relief. She had her 1st pfizer vaccine on 4/9. She had a negative rapid covid test today and was advised to come to ED. She also noticed a bruise on her right leg

## 2019-06-07 NOTE — ED Notes (Signed)
Per lab, Serum Copper test needs to be drawn in a navy blue top tube; this facility does not have that tube; test will need to drawn once pt is transferred to hospital .

## 2019-06-08 ENCOUNTER — Inpatient Hospital Stay (HOSPITAL_COMMUNITY): Payer: BC Managed Care – PPO

## 2019-06-08 ENCOUNTER — Emergency Department (HOSPITAL_COMMUNITY): Payer: BC Managed Care – PPO

## 2019-06-08 ENCOUNTER — Encounter (HOSPITAL_COMMUNITY): Payer: Self-pay | Admitting: Internal Medicine

## 2019-06-08 DIAGNOSIS — Z90710 Acquired absence of both cervix and uterus: Secondary | ICD-10-CM | POA: Diagnosis not present

## 2019-06-08 DIAGNOSIS — R202 Paresthesia of skin: Secondary | ICD-10-CM | POA: Diagnosis not present

## 2019-06-08 DIAGNOSIS — T50B95A Adverse effect of other viral vaccines, initial encounter: Secondary | ICD-10-CM | POA: Diagnosis present

## 2019-06-08 DIAGNOSIS — Z20822 Contact with and (suspected) exposure to covid-19: Secondary | ICD-10-CM | POA: Diagnosis present

## 2019-06-08 DIAGNOSIS — I10 Essential (primary) hypertension: Secondary | ICD-10-CM | POA: Diagnosis present

## 2019-06-08 DIAGNOSIS — Z880 Allergy status to penicillin: Secondary | ICD-10-CM | POA: Diagnosis not present

## 2019-06-08 DIAGNOSIS — D569 Thalassemia, unspecified: Secondary | ICD-10-CM | POA: Diagnosis present

## 2019-06-08 DIAGNOSIS — G43909 Migraine, unspecified, not intractable, without status migrainosus: Secondary | ICD-10-CM | POA: Diagnosis present

## 2019-06-08 DIAGNOSIS — G610 Guillain-Barre syndrome: Principal | ICD-10-CM

## 2019-06-08 DIAGNOSIS — Z6841 Body Mass Index (BMI) 40.0 and over, adult: Secondary | ICD-10-CM

## 2019-06-08 DIAGNOSIS — Z8249 Family history of ischemic heart disease and other diseases of the circulatory system: Secondary | ICD-10-CM | POA: Diagnosis not present

## 2019-06-08 DIAGNOSIS — R200 Anesthesia of skin: Secondary | ICD-10-CM | POA: Diagnosis not present

## 2019-06-08 DIAGNOSIS — Z91040 Latex allergy status: Secondary | ICD-10-CM | POA: Diagnosis not present

## 2019-06-08 DIAGNOSIS — M4802 Spinal stenosis, cervical region: Secondary | ICD-10-CM | POA: Diagnosis present

## 2019-06-08 DIAGNOSIS — Z9049 Acquired absence of other specified parts of digestive tract: Secondary | ICD-10-CM | POA: Diagnosis not present

## 2019-06-08 DIAGNOSIS — E876 Hypokalemia: Secondary | ICD-10-CM | POA: Diagnosis present

## 2019-06-08 DIAGNOSIS — Z882 Allergy status to sulfonamides status: Secondary | ICD-10-CM | POA: Diagnosis not present

## 2019-06-08 LAB — FOLATE: Folate: 21.8 ng/mL (ref >5.9)

## 2019-06-08 LAB — CSF CELL COUNT WITH DIFFERENTIAL
RBC Count, CSF: 0 /mm3
Tube #: 3
WBC, CSF: 2 /mm3 (ref 0–5)

## 2019-06-08 LAB — PROTEIN AND GLUCOSE, CSF
Glucose, CSF: 59 mg/dL (ref 40–70)
Total  Protein, CSF: 70 mg/dL — ABNORMAL HIGH (ref 15–45)

## 2019-06-08 LAB — VITAMIN B12: Vitamin B-12: 847 pg/mL (ref 180–914)

## 2019-06-08 LAB — TSH: TSH: 2.241 u[IU]/mL (ref 0.350–4.500)

## 2019-06-08 LAB — HIV ANTIBODY (ROUTINE TESTING W REFLEX): HIV Screen 4th Generation wRfx: NONREACTIVE

## 2019-06-08 LAB — SARS CORONAVIRUS 2 (TAT 6-24 HRS): SARS Coronavirus 2: NEGATIVE

## 2019-06-08 LAB — C-REACTIVE PROTEIN: CRP: 1.7 mg/dL — ABNORMAL HIGH (ref <1.0)

## 2019-06-08 MED ORDER — ZOLPIDEM TARTRATE 5 MG PO TABS: 5.0000 mg | ORAL_TABLET | Freq: Every evening | ORAL | Status: DC | PRN

## 2019-06-08 MED ORDER — ENOXAPARIN SODIUM 40 MG/0.4ML ~~LOC~~ SOLN
40.0000 mg | SUBCUTANEOUS | Status: DC
  Administered 2019-06-08 – 2019-06-12 (×5): 40 mg via SUBCUTANEOUS
  Filled 2019-06-08 (×5): qty 0.4

## 2019-06-08 MED ORDER — GADOBUTROL 1 MMOL/ML IV SOLN
10.0000 mL | Freq: Once | INTRAVENOUS | Status: AC | PRN
  Administered 2019-06-08: 11:00:00 10 mL via INTRAVENOUS

## 2019-06-08 MED ORDER — HYDRALAZINE HCL 20 MG/ML IJ SOLN: 5.0000 mg | INTRAMUSCULAR | Status: DC | PRN

## 2019-06-08 MED ORDER — TOPIRAMATE 100 MG PO TABS
100.0000 mg | ORAL_TABLET | Freq: Two times a day (BID) | ORAL | Status: DC
  Administered 2019-06-08 – 2019-06-13 (×10): 100 mg via ORAL
  Filled 2019-06-08 (×10): qty 1

## 2019-06-08 MED ORDER — DOCUSATE SODIUM 100 MG PO CAPS
100.0000 mg | ORAL_CAPSULE | Freq: Two times a day (BID) | ORAL | Status: DC
  Administered 2019-06-09: 11:00:00 100 mg via ORAL
  Filled 2019-06-08 (×7): qty 1

## 2019-06-08 MED ORDER — ONDANSETRON HCL 4 MG PO TABS: 4.0000 mg | ORAL_TABLET | Freq: Four times a day (QID) | ORAL | Status: DC | PRN

## 2019-06-08 MED ORDER — IMMUNE GLOBULIN (HUMAN) 10 GM/100ML IV SOLN
400.0000 mg/kg | INTRAVENOUS | Status: AC
  Administered 2019-06-08 – 2019-06-12 (×5): 45 g via INTRAVENOUS
  Filled 2019-06-08 (×2): qty 50
  Filled 2019-06-08: qty 400
  Filled 2019-06-08: qty 50
  Filled 2019-06-08: qty 400

## 2019-06-08 MED ORDER — HYDROCODONE-ACETAMINOPHEN 5-325 MG PO TABS
1.0000 | ORAL_TABLET | ORAL | Status: DC | PRN
  Administered 2019-06-10: 21:00:00 1 via ORAL
  Administered 2019-06-10 – 2019-06-12 (×4): 2 via ORAL
  Administered 2019-06-12: 15:00:00 1 via ORAL
  Filled 2019-06-08: qty 2
  Filled 2019-06-08: qty 1
  Filled 2019-06-08 (×3): qty 2
  Filled 2019-06-08: qty 1

## 2019-06-08 MED ORDER — ACETAMINOPHEN 650 MG RE SUPP: 650.0000 mg | Freq: Four times a day (QID) | RECTAL | Status: DC | PRN

## 2019-06-08 MED ORDER — BISACODYL 5 MG PO TBEC: 5.0000 mg | DELAYED_RELEASE_TABLET | Freq: Every day | ORAL | Status: DC | PRN

## 2019-06-08 MED ORDER — ONDANSETRON HCL 4 MG/2ML IJ SOLN
4.0000 mg | Freq: Four times a day (QID) | INTRAMUSCULAR | Status: DC | PRN
  Administered 2019-06-12: 21:00:00 4 mg via INTRAVENOUS
  Filled 2019-06-08: qty 2

## 2019-06-08 MED ORDER — POLYETHYLENE GLYCOL 3350 17 G PO PACK: 17.0000 g | PACK | Freq: Every day | ORAL | Status: DC | PRN

## 2019-06-08 MED ORDER — MORPHINE SULFATE (PF) 2 MG/ML IV SOLN: 2.0000 mg | INTRAVENOUS | Status: DC | PRN

## 2019-06-08 MED ORDER — ACETAMINOPHEN 325 MG PO TABS
650.0000 mg | ORAL_TABLET | Freq: Four times a day (QID) | ORAL | Status: DC | PRN
  Administered 2019-06-12 (×2): 650 mg via ORAL
  Filled 2019-06-08 (×2): qty 2

## 2019-06-08 NOTE — ED Notes (Signed)
Interfacility transfer from [facility]. Dx: Paresthesia with pain to both lower extremities.

## 2019-06-08 NOTE — Procedures (Signed)
LUMBAR PUNCTURE (SPINAL TAP) PROCEDURE NOTE  Indication:  Ascending paresthesias and weakness   Proceduralists: Milon Dikes MD and Felicie Morn, PA-C assisting   Risks of the procedure were dicussed with the patient including post-LP headache, bleeding, infection, weakness/numbness of legs(radiculopathy), death.    Consent obtained from: The patient   Procedure Note The patient was prepped and draped, and using sterile technique a 20 gauge quinke spinal needle was inserted in the L4-5 space with patient in the sitting position.   Opening pressure was  was not measured due to the patient being in sitting position. Approximately 14 cc of CSF were obtained and sent for analysis.  Patient tolerated the procedure well and blood loss was minimal.  -- Milon Dikes, MD [facility] Pager: [phone] If 7pm to 7am, please call on call as listed on AMION.

## 2019-06-08 NOTE — ED Provider Notes (Signed)
Patient signed out to me from Dr. Wilkie Aye.  She had actually been seen by the attending and is pending an admission to [facility] for neurologic symptoms. Physical Exam  BP 110/75   Pulse 60   Temp 98.5 F (36.9 C) (Oral)   Resp 16   Ht 5\' 4"  (1.626 m)   Wt 112.5 kg   SpO2 100%   BMI 42.57 kg/m   Physical Exam  ED Course/Procedures     Procedures  MDM  At 9 AM I was asked to transfer the patient in ED to ED as we have been waiting on a bed for 12+ hours.  Discussed with Dr. at [facility] who accepts the patient in transfer.  On arrival neurology should be made aware of the patient but she already has an admission to [facility] ordered.       [facility], MD 06/08/19 [phone]

## 2019-06-08 NOTE — ED Notes (Signed)
Pt. Transported to MRI 

## 2019-06-08 NOTE — ED Notes (Signed)
Advised by MRI tech. Scan will be done after 1730 as pt. Had previous scan with contrast today and need to wait longer.

## 2019-06-08 NOTE — Progress Notes (Addendum)
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  Will start IVIG x5 days. Continue PT/OT   -- Milon Dikes, MD [facility] Pager: [phone] If 7pm to 7am, please call on call as listed on AMION.   Addendum Discussed with infectious diseases specialist on-call, Dr. Drue Second for the need to report this as potential adverse event of vaccination.  Have forwarded her the request.  -- Milon Dikes, MD [facility] Pager: [phone] If 7pm to 7am, please call on call as listed on AMION.

## 2019-06-08 NOTE — ED Triage Notes (Signed)
Interfacility transfer from [facility] ED. AOx4, in no acute distress. C/O Pain in both lower extremities x 1 week and paresthesia. Three days ago symptoms progressed to the left arm and hand, none in the right.

## 2019-06-08 NOTE — Consult Note (Addendum)
Neurology Consultation  Reason for Consult: Bilateral lower extremity numbness Referring Physician: Jonah Blue, MD  CC: Ascending bilateral lower extremity paresthesias and burning.  History is obtained from: Patient  HPI: Kaitlyn Ashley is a 49 y.o. female with history of thalassemia, hypertension and anemia.  Patient initially to med [facility] on 06/07/2019 with chief complaint of burning/throbbing and numbness in bilateral legs.  Patient was seen by teleneurology at that time.  She was then transferred to [facility] for further evaluation.  Neurology was asked to see the patient in the ED to further evaluate her symptoms.  Patient states that she woke on Monday morning 1 week ago and when she placed her feet on the ground she suddenly felt severe burning and pins-and-needles.  As she got up she noticed that she also felt very weak in her legs.  This continued throughout the day and in fact, started ascending up to her knees.  She also noticed that it was going up from her left hand to mid forearm.  It was after this time that it plateaued and did not get worse.  Due to no resolution of her symptoms, she went to med [facility] emergency department.  On consultation patient states that it has not worsened.  She still has burning sensation/pins-and-needles sensation on the plantar surface of her foot along with throbbing sensations that travel up to just below kneecap.  She continues to have paresthesias of her left arm up to forearm.  She continues to have gait instability.  In fact at one point in time, she did fall.  She has had no breathing issues, vision issues, double vision, urinary and or defecation issues.  She denies any abdominal issues.  She is on Topamax for migraine headaches.  She states that it was increased about 2 months ago in a slow manner from 25 mg twice daily than the next week to 50 mg twice daily then 100 mg twice daily.  She denies any numbness  tingling or paresthesias from that.  The only side effect she states that she felt that she was getting increased headaches.  She actually has decreased the medication down to 100 mg/day by herself.  She has received her Pfizer Covid vaccine on 05/29/2019-first dose  Past Medical History:  Diagnosis Date  . Anemia   . Hypertension   . Thalassemia    Family History  Problem Relation Age of Onset  . Hypertension Mother   . Hypertension Father    Social History:   reports that she has never smoked. She has never used smokeless tobacco. She reports previous alcohol use. She reports that she does not use drugs.  Medications  Current Facility-Administered Medications:  .  acetaminophen (TYLENOL) tablet 650 mg, 650 mg, Oral, Q6H PRN **OR** acetaminophen (TYLENOL) suppository 650 mg, 650 mg, Rectal, Q6H PRN, Jonah Blue, MD .  bisacodyl (DULCOLAX) EC tablet 5 mg, 5 mg, Oral, Daily PRN, Jonah Blue, MD .  docusate sodium (COLACE) capsule 100 mg, 100 mg, Oral, BID, Jonah Blue, MD .  enoxaparin (LOVENOX) injection 40 mg, 40 mg, Subcutaneous, Q24H, Jonah Blue, MD .  hydrALAZINE (APRESOLINE) injection 5 mg, 5 mg, Intravenous, Q4H PRN, Jonah Blue, MD .  HYDROcodone-acetaminophen (NORCO/VICODIN) 5-325 MG per tablet 1-2 tablet, 1-2 tablet, Oral, Q4H PRN, Jonah Blue, MD .  morphine 2 MG/ML injection 2 mg, 2 mg, Intravenous, Q2H PRN, Jonah Blue, MD .  ondansetron [facility]) tablet 4 mg, 4 mg, Oral, Q6H PRN **  OR** ondansetron (ZOFRAN) injection 4 mg, 4 mg, Intravenous, Q6H PRN, Jonah Blue, MD .  polyethylene glycol (MIRALAX / GLYCOLAX) packet 17 g, 17 g, Oral, Daily PRN, Jonah Blue, MD .  topiramate (TOPAMAX) tablet 100 mg, 100 mg, Oral, BID, Jonah Blue, MD .  zolpidem [facility]) tablet 5 mg, 5 mg, Oral, QHS PRN, Jonah Blue, MD  Current Outpatient Medications:  .  furosemide (LASIX) 20 MG tablet, Take 20 mg by mouth daily., Disp: , Rfl:  .  phentermine  (ADIPEX-P) 37.5 MG tablet, Take 37.5 mg by mouth daily., Disp: , Rfl:  .  topiramate (TOPAMAX) 100 MG tablet, Take 100 mg by mouth 2 (two) times daily., Disp: , Rfl:   ROS:   General ROS: negative for - chills, fatigue, fever, night sweats, weight gain or weight loss Psychological ROS: negative for - behavioral disorder, hallucinations, memory difficulties, mood swings or suicidal ideation Ophthalmic ROS: negative for - blurry vision, double vision, eye pain or loss of vision ENT ROS: negative for - epistaxis, nasal discharge, oral lesions, sore throat, tinnitus or vertigo Allergy and Immunology ROS: negative for - hives or itchy/watery eyes Respiratory ROS: negative for - cough, hemoptysis, shortness of breath or wheezing Cardiovascular ROS: negative for - chest pain, dyspnea on exertion, edema or irregular heartbeat Gastrointestinal ROS: negative for - abdominal pain, diarrhea, hematemesis, nausea/vomiting or stool incontinence Genito-Urinary ROS: Positive for - dysuria, hematuria, incontinence or urinary frequency/urgency Musculoskeletal ROS: negative for -  muscular weakness Neurological ROS: as noted in HPI Dermatological ROS: negative for rash and skin lesion changes  Exam: Current vital signs: BP 130/61 (BP Location: Left Arm)   Pulse 65   Temp 98.3 F (36.8 C) (Oral)   Resp 16   Ht 5\' 4"  (1.626 m)   Wt 112.5 kg   SpO2 100%   BMI 42.57 kg/m  Vital signs in last 24 hours: Temp:  [97.5 F (36.4 C)-98.3 F (36.8 C)] 98.3 F (36.8 C) (04/19 1029) Pulse Rate:  [60-87] 65 (04/19 1029) Resp:  [12-21] 16 (04/19 1029) BP: (108-146)/(61-110) 130/61 (04/19 1029) SpO2:  [95 %-100 %] 100 % (04/19 1029) Weight:  [112.5 kg] 112.5 kg (04/19 1030) Constitutional: Appears well-developed and well-nourished.  Psych: Affect appropriate to situation Eyes: No scleral injection HENT: No OP obstrucion Head: Normocephalic.  Cardiovascular: Normal rate and regular rhythm.  Respiratory:  Effort normal, non-labored breathing GI: Soft.  No distension. There is no tenderness.  Skin: WDI Neuro: Mental Status: Patient is awake, alert, oriented to person, place, month, year, and situation. Speech-without dysarthria, aphasia, intact naming-repeating-comprehension. Patient is able to give a clear and coherent history. Cranial Nerves: II: Visual Fields are full.  III,IV, VI: EOMI without ptosis or diploplia. Pupils equal, round and reactive to light V: Facial sensation is symmetric to temperature VII: Facial movement is symmetric.  VIII: hearing is intact to voice X: Palat elevates symmetrically XI: Shoulder shrug is symmetric. XII: tongue is midline without atrophy or fasciculations.  Motor: Tone is normal. Bulk is normal. 5/5 biilateral UE.  4+/5 strength was present in b/l lower extremities.  Sensory: Patient has no proprioception in her toes.  Decreased sensation and light touch, pinprick up to mid calf.  Vibratory sensation intact in the lower extremities.  In the left arm she has decreased sensation to light touch, pinprick and temperature up to mid forearm.  Otherwise within normal limits Deep Tendon Reflexes: 2+ biceps.  1+ knee jerks bilaterally.  And barely present ankle jerks bilaterally. Plantars: Toes  are downgoing bilaterally.  No Hoffmann sign. Cerebellar: FNF and HKS are intact bilaterally  Labs I have reviewed labs in epic and the results pertinent to this consultation are: CBC    Component Value Date/Time   WBC 5.7 06/07/2019 1427   RBC 5.81 (H) 06/07/2019 1427   HGB 14.2 06/07/2019 1427   HCT 45.9 06/07/2019 1427   PLT 230 06/07/2019 1427   MCV 79.0 (L) 06/07/2019 1427   MCH 24.4 (L) 06/07/2019 1427   MCHC 30.9 06/07/2019 1427   RDW 14.2 06/07/2019 1427   LYMPHSABS 2.9 11/23/2009 1500   MONOABS 0.4 11/23/2009 1500   EOSABS 0.1 11/23/2009 1500   BASOSABS 0.1 11/23/2009 1500    CMP     Component Value Date/Time   NA 138 06/07/2019 1427   K  3.2 (L) 06/07/2019 1427   CL 101 06/07/2019 1427   CO2 27 06/07/2019 1427   GLUCOSE 95 06/07/2019 1427   BUN 12 06/07/2019 1427   CREATININE 1.05 (H) 06/07/2019 1427   CALCIUM 9.0 06/07/2019 1427   GFRNONAA >60 06/07/2019 1427   GFRAA >60 06/07/2019 1427   Imaging I have reviewed the images obtained:  CT-scan of the brain-negative head CT  MRI examination of the brain-exam slightly limited by susceptibility artifact at the level of the frontoparietal vertex.  No evidence of acute intracranial abnormality.  2 nonspecific punctate foci of T2 hyperintensities within the right frontal lobe Saban matter.  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  Multilevel moderate to severe cervical neuroforaminal stenosis.  Normal thoracic spine.  Kaitlyn Quill PA-C [facility] [phone]  M-F  (9:00 am- 5:00 PM)  06/08/2019, 3:00 PM     Assessment:  This is a 49 year old female with sudden onset and quick progression of ascending paresthesias/burning and decreased sensation of bilateral legs along with a left arm which plateaued last Monday.  Currently still having same symptoms.  Thus far no etiology found by MRI.  She has been on Topamax but has decreased the dose is making Topamax and unlikely culprit for the paresthesias.   She has diminished distal reflexes and differential should include Guillain-Barr. I am not sure if this is any correlation but she also received her first shot of Covid vaccine a week before the symptoms started.  Impression: -Lower extremity paresthesias-evaluate for Guillain-Barr syndrome  Recommendations: -Obtain MRI of lumbar spine to evaluate for enhancement of nerve roots-likely tomorrow because she is already received contrast for other MRIs this morning. -Obtain lumbar puncture in the ED to  evaluate for any evidence of abdominal cytological dissociation.  If found, would need IVIG. -PT/OT -Fall precautions.  Neurology will follow  -- Amie Portland, MD [facility] Pager: [phone] If 7pm to 7am, please call on call as listed on AMION.

## 2019-06-08 NOTE — H&P (Signed)
History and Physical    Kaitlyn Ashley MRN:[id number] DOB: 08/09/1970 DOA: 06/07/2019  PCP: Osei-Bonsu, George, MD Consultants:  Patel - OB/GYN Patient coming from:  Home - lives with husband, son; NOK: Husband, Charles Tedeschi, [phone]  Chief Complaint:  B LE pain  HPI: Kaitlyn Ashley is a 49 y.o. female with medical history significant of HTN and thalassemia presenting with B leg pain, numbness, shaking.  She reports that she received her initial COVID-19 injection on 4/9.  On 4/12, she developed acute intermittent tingling, numbness from the knees to her toes.  It is equal and symmetric on both feet.  She had, years ago, been diagnosed with R foot neuropathy and given Neurontin - but this did not feel similar to her prior neuropathy.  Her legs would "go numb" periodically and she was unable to bear weight on them.  The numbness was periodic but the pain was continuous.  On Friday, 4/17, she developed L hand tingling.  She has chronic intermittent chest pain and takes Lasix for LE edema.   ED Course:  [facility], per Dr. Kakrakandy:  49-year-old female presents with tingling and numbness of the lower and upper extremities MRI unremarkable of the C-spine and T-spine. Neurologist 1 myelopathy labs sent. Dr. Lindzen neurologist wanted MRI of the brain which [facility] and it has to be done at hospital when patient arrives.   Review of Systems: As per HPI; otherwise review of systems reviewed and negative.   Ambulatory Status:  Ambulates without assistance  COVID Vaccine Status:  First shot - 4/9; scheduled for second on 4/30  Past Medical History:  Diagnosis Date  . Anemia   . Hypertension   . Migraines   . Morbid obesity with BMI of 40.0-44.9, adult (HCC)   . Thalassemia     Past Surgical History:  Procedure Laterality Date  . ABDOMINAL HYSTERECTOMY    . BREAST REDUCTION SURGERY    . CESAREAN SECTION      Social History   Socioeconomic History   . Marital status: Married    Spouse name: Not on file  . Number of children: Not on file  . Years of education: Not on file  . Highest education level: Not on file  Occupational History  . Not on file  Tobacco Use  . Smoking status: Never Smoker  . Smokeless tobacco: Never Used  Substance and Sexual Activity  . Alcohol use: Yes    Comment: rare  . Drug use: Never  . Sexual activity: Not on file    Comment: last new partner was in 2019  Other Topics Concern  . Not on file  Social History Narrative  . Not on file   Social Determinants of Health   Financial Resource Strain:   . Difficulty of Paying Living Expenses:   Food Insecurity:   . Worried About Running Out of Food in the Last Year:   . Ran Out of Food in the Last Year:   Transportation Needs:   . Lack of Transportation (Medical):   . Lack of Transportation (Non-Medical):   Physical Activity:   . Days of Exercise per Week:   . Minutes of Exercise per Session:   Stress:   . Feeling of Stress :   Social Connections:   . Frequency of Communication with Friends and Family:   . Frequency of Social Gatherings with Friends and Family:   . Attends Religious Services:   . Active Member   of Clubs or Organizations:   . Attends Archivist Meetings:   Marland Kitchen Marital Status:   Intimate Partner Violence:   . Fear of Current or Ex-Partner:   . Emotionally Abused:   Marland Kitchen Physically Abused:   . Sexually Abused:     Allergies  Allergen Reactions  . Latex Itching  . Penicillins   . Sulfa Antibiotics     Family History  Problem Relation Age of Onset  . Hypertension Mother   . Hypertension Father     Prior to Admission medications   Medication Sig Start Date End Date Taking? Authorizing Provider  furosemide (LASIX) 20 MG tablet Take 20 mg by mouth daily. 05/14/19  Yes [provider]  phentermine (ADIPEX-P) 37.5 MG tablet Take 37.5 mg by mouth daily. 05/16/19  Yes [provider]  topiramate  (TOPAMAX) 100 MG tablet Take 100 mg by mouth 2 (two) times daily. 05/14/19  Yes [provider]    Physical Exam: Vitals:   06/08/19 1030 06/08/19 1200 06/08/19 1400 06/08/19 1600  BP:  132/62 118/66 128/70  Pulse:  70 70 78  Resp:  _0 Temp:  98.9 F (37.2 C) 98.4 F (36.9 C) 98.4 F (36.9 C)  TempSrc:  Oral Oral Oral  SpO2:  98% 98% 98%  Weight: 112.5 kg     Height: 5' 4" (1.626 m)        . General:  Appears calm and comfortable and is NAD . Eyes:   EOMI, normal lids, iris . ENT:  grossly normal hearing, lips & tongue, mmm; appropriate dentition . Neck:  no LAD, masses or thyromegaly . Cardiovascular:  RRR, no m/r/g. No LE edema.  Marland Kitchen Respiratory:   CTA bilaterally with no wheezes/rales/rhonchi.  Normal respiratory effort. . Abdomen:  soft, NT, ND, NABS . Skin:  no rash or induration seen on limited exam . Musculoskeletal:  grossly normal tone BUE/BLE, good ROM, no bony abnormality . Lower extremity:  No LE edema.  Limited foot exam with no ulcerations.  2+ distal pulses.  Diminished sensation and plantar reflexes. Marland Kitchen Psychiatric:  grossly normal mood and affect, speech fluent and appropriate, AOx3 . Neurologic:  CN 2-12 grossly intact, moves all extremities in coordinated fashion    Radiological Exams on Admission: CT Head Wo Contrast  Result Date: 06/07/2019 CLINICAL DATA:  Paresthesias in bilateral lower extremities for 1 week, now LEFT arm. Slight headache since this morning. EXAM: CT HEAD WITHOUT CONTRAST TECHNIQUE: Contiguous axial images were obtained from the base of the skull through the vertex without intravenous contrast. COMPARISON:  None. FINDINGS: Brain: Ventricles are normal in size. All areas of the brain demonstrate appropriate gray-Gotwalt matter attenuation. There is no mass, hemorrhage, edema or other evidence of acute parenchymal abnormality. No extra-axial hemorrhage. Vascular: No hyperdense vessel or unexpected calcification. Skull: Normal.  Negative for fracture or focal lesion. Sinuses/Orbits: No acute finding. Other: None. IMPRESSION: Negative head CT. No intracranial mass, hemorrhage or edema. Electronically Signed   By: Franki Cabot M.D.   On: 06/07/2019 14:56   MR Brain W and Wo Contrast  Result Date: 06/08/2019 CLINICAL DATA:  Paresthesias and pain of bilateral lower legs for 1 week, 3 days ago symptoms spread to left arm. EXAM: MRI HEAD WITHOUT AND WITH CONTRAST TECHNIQUE: Multiplanar, multiecho pulse sequences of the brain and surrounding structures were obtained without and with intravenous contrast. CONTRAST:  65m GADAVIST GADOBUTROL 1 MMOL/ML IV SOLN COMPARISON:  Non-contrast head CT 06/07/2019, report from head  CT 06/20/2011 (images unavailable). FINDINGS: Brain: The examination is slightly limited due to susceptibility artifact at the level of the frontoparietal vertex. There is no evidence of acute infarct. No evidence of intracranial mass. No midline shift or extra-axial fluid collection. No chronic intracranial blood products. There are two punctate foci of T2 hyperintensity within the right frontal lobe Heritage matter, nonspecific. 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 No other abnormal intracranial enhancement is identified. Cerebral volume is normal for age. Vascular: Flow voids maintained within the proximal large arterial vessels. Skull and upper cervical spine: No focal marrow lesion. Sinuses/Orbits: Visualized orbits demonstrate no acute abnormality. Moderate-sized left maxillary sinus mucous retention cyst. Background mild paranasal sinus mucosal thickening. No significant mastoid effusion IMPRESSION: 1. Exam slightly limited by susceptibility artifact at the level of the frontoparietal vertex. 2. No evidence of acute intracranial abnormality. 3. Two nonspecific punctate foci of T2 hyperintensity within the right frontal lobe Payer matter. 4. Incidentally noted left frontal  lobe developmental venous anomaly. 5. Moderate-sized left maxillary sinus mucous retention cyst with mild background paranasal sinus mucosal thickening. Electronically Signed   By: Kyle  Golden DO   On: 06/08/2019 11:39   MR Cervical Spine W or Wo Contrast  Result Date: 06/07/2019 CLINICAL DATA:  Left arm tingling with bilateral lower extremity swelling EXAM: MRI CERVICAL AND THORACIC WITHOUT AND WITH CONTRAST TECHNIQUE: Multiplanar and multiecho pulse sequences of the cervical and thoracic spine were obtained without and with intravenous contrast. CONTRAST:  7.5mL GADAVIST GADOBUTROL 1 MMOL/ML IV SOLN COMPARISON:  None. FINDINGS: MRI CERVICAL SPINE FINDINGS Alignment: Straightening of normal cervical lordosis. Normal alignment. Vertebrae: No fracture, evidence of discitis, or bone lesion. Cord:  Normal signal and morphology. Paraspinal and other soft tissues: Negative Disc levels: C1-C2: Unremarkable. C2-C3: Left uncovertebral hypertrophy with mild disc bulge. There is no spinal canal stenosis. Mild left neural foraminal stenosis. C3-C4: Disc bulge with bilateral uncovertebral hypertrophy. Mild spinal canal stenosis. Moderate bilateral neural foraminal stenosis. C4-C5: There is an intermediate disc bulge with bilateral uncovertebral hypertrophy. Small left subarticular protrusion. Moderate spinal canal stenosis. Severe right and moderate left neural foraminal stenosis. C5-C6: Disc bulge with uncovertebral hypertrophy. Mild spinal canal stenosis. Moderate bilateral neural foraminal stenosis. C6-C7: Disc bulge with uncovertebral hypertrophy, right worse than left. Mild spinal canal stenosis. Severe right and mild left neural foraminal stenosis. C7-T1: Normal disc space and facet joints. There is no spinal canal stenosis. No neural foraminal stenosis. MRI THORACIC SPINE FINDINGS Alignment: Normal Vertebrae: Normal Spinal cord: Normal Paraspinal soft tissues: Negative Disc levels: No disc herniation or stenosis.  IMPRESSION: 1. No acute abnormality of the cervical or thoracic spine. 2. 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 3. Multilevel moderate to severe cervical neural foraminal stenosis. 4. Normal MRI of the thoracic spine. Electronically Signed   By: Kevin  Herman M.D.   On: 06/07/2019 19:16   MR THORACIC SPINE W WO CONTRAST  Result Date: 06/07/2019 CLINICAL DATA:  Left arm tingling with bilateral lower extremity swelling EXAM: MRI CERVICAL AND THORACIC WITHOUT AND WITH CONTRAST TECHNIQUE: Multiplanar and multiecho pulse sequences of the cervical and thoracic spine were obtained without and with intravenous contrast. CONTRAST:  7.5mL GADAVIST GADOBUTROL 1 MMOL/ML IV SOLN COMPARISON:  None. FINDINGS: MRI CERVICAL SPINE FINDINGS Alignment: Straightening of normal cervical lordosis. Normal alignment. Vertebrae: No fracture, evidence of discitis, or bone lesion. Cord:  Normal signal and morphology. Paraspinal and other soft tissues: Negative Disc levels: C1-C2: Unremarkable.   C2-C3: Left uncovertebral hypertrophy with mild disc bulge. There is no spinal canal stenosis. Mild left neural foraminal stenosis. C3-C4: Disc bulge with bilateral uncovertebral hypertrophy. Mild spinal canal stenosis. Moderate bilateral neural foraminal stenosis. C4-C5: There is an intermediate disc bulge with bilateral uncovertebral hypertrophy. Small left subarticular protrusion. Moderate spinal canal stenosis. Severe right and moderate left neural foraminal stenosis. C5-C6: Disc bulge with uncovertebral hypertrophy. Mild spinal canal stenosis. Moderate bilateral neural foraminal stenosis. C6-C7: Disc bulge with uncovertebral hypertrophy, right worse than left. Mild spinal canal stenosis. Severe right and mild left neural foraminal stenosis. C7-T1: Normal disc space and facet joints. There is no spinal canal stenosis. No neural foraminal stenosis. MRI THORACIC SPINE FINDINGS Alignment: Normal Vertebrae:  Normal Spinal cord: Normal Paraspinal soft tissues: Negative Disc levels: No disc herniation or stenosis. IMPRESSION: 1. No acute abnormality of the cervical or thoracic spine. 2. 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 3. Multilevel moderate to severe cervical neural foraminal stenosis. 4. Normal MRI of the thoracic spine. Electronically Signed   By: Ulyses Jarred M.D.   On: 06/07/2019 19:16    EKG: Independently reviewed.  NSR with rate 75; low voltage with no evidence of acute ischemia   Labs on Admission: I have personally reviewed the available labs and imaging studies at the time of the admission.  Pertinent labs:   K+ 3.2 BUN 12/Creatinine 1.05/GFR >60 Folate 21.8 CRP 1.7 B12 847 Unremarkable CBC ESR 18 TSH 2.241 COVID negative   Assessment/Plan Principal Problem:   Numbness and tingling Active Problems:   Thalassemia   Hypertension   Morbid obesity with BMI of 40.0-44.9, adult (HCC)   Numbness and tingling -Patient presented to [facility] with complaints as above -Stocking:glove distribution led to B12 and folate checked; will add RPR -Teleneurology was consulted and recommended MRI T-spine and C-spine to evaluate for demyelination, as well as a multitude of labs and PT/OT -CT head was negative but MRI brain was not initially recommended for unclear reason -In addition, with B LE symptoms, MRI L-spine would also have been a reasonable consideration -She was also not apparently considered for a possible ascending paralysis - GBS is a serious consideration, particularly in light of her COVID vaccine just a few days before the onset of symptoms -Patient was discussed with Dr. Cheral Marker and transferred to [facility] for admission and MRI brain (generally unremarkable) -Neurology was consulted and is concerned about GBS and so LP was performed in the ER -MRI L-spine would be helpful to look for nerve root enhancement -Will need PT/OT -Will admit due  to concern for GBS  HTN -Previously diagnosed but not on medications at this time -Hold Lasix (used daily for edema)  Thalassemia with anemia -Hgb 14.2 -This is largely inconsistent with her baseline, which she reports is 8 at best -Will follow  Morbid obesity Body mass index is 42.57 kg/m.  -Weight loss should be encouraged -Outpatient PCP/bariatric medicine/bariatric surgery f/u encouraged -Hold phenteramine  Migraines -Continue Topamax for prophylaxis   Note: This patient has been tested and is negative for the novel coronavirus COVID-19.  DVT prophylaxis:  Lovenox  Code Status:  Full - confirmed with patient Family Communication: None present; patient reported that her husband will be here soon. Disposition Plan:  The patient is from: home  Anticipated d/c is to: to be determined based on clinical progress.  Anticipated d/c date will depend on clinical response to treatment, but possibly 5+ days  Patient is currently: acutely ill  Consults called: Neurology; PT/OT Admission status:  Admit - It is my clinical opinion that admission to INPATIENT is reasonable and necessary because of the expectation that this patient will require hospital care that crosses at least 2 midnights to treat this condition based on the medical complexity of the problems presented.  Given the aforementioned information, the predictability of an adverse outcome is felt to be significant.    Karmen Bongo MD [facility]   How to contact the [facility] Attending or Consulting provider [place] or covering provider during after hours [place], for this patient?  1. Check the care team in [facility] and look for a) attending/consulting [facility] provider listed and b) the [facility] team listed 2. Log into [email or web address] and use [place]'s universal password to access. If you do not have the password, please contact the hospital operator. 3. Locate the [facility] provider you are looking for under [facility] and page to  a number that you can be directly reached. 4. If you still have difficulty reaching the provider, please page the [facility] (Director on Call) for the Hospitalists listed on amion for assistance.   06/08/2019, 6:35 PM

## 2019-06-09 ENCOUNTER — Inpatient Hospital Stay (HOSPITAL_COMMUNITY): Payer: BC Managed Care – PPO

## 2019-06-09 DIAGNOSIS — G610 Guillain-Barre syndrome: Secondary | ICD-10-CM | POA: Diagnosis not present

## 2019-06-09 DIAGNOSIS — I10 Essential (primary) hypertension: Secondary | ICD-10-CM

## 2019-06-09 DIAGNOSIS — R200 Anesthesia of skin: Secondary | ICD-10-CM | POA: Diagnosis not present

## 2019-06-09 DIAGNOSIS — R202 Paresthesia of skin: Secondary | ICD-10-CM | POA: Diagnosis not present

## 2019-06-09 DIAGNOSIS — M48061 Spinal stenosis, lumbar region without neurogenic claudication: Secondary | ICD-10-CM | POA: Diagnosis not present

## 2019-06-09 LAB — BASIC METABOLIC PANEL
Anion gap: 8 (ref 5–15)
BUN: 15 mg/dL (ref 6–20)
CO2: 23 mmol/L (ref 22–32)
Calcium: 8.6 mg/dL — ABNORMAL LOW (ref 8.9–10.3)
Chloride: 105 mmol/L (ref 98–111)
Creatinine, Ser: 1.01 mg/dL — ABNORMAL HIGH (ref 0.44–1.00)
GFR calc Af Amer: >60 mL/min (ref >60)
GFR calc non Af Amer: >60 mL/min (ref >60)
Glucose, Bld: 107 mg/dL — ABNORMAL HIGH (ref 70–99)
Potassium: 3.4 mmol/L — ABNORMAL LOW (ref 3.5–5.1)
Sodium: 136 mmol/L (ref 135–145)

## 2019-06-09 LAB — CBC
HCT: 42 % (ref 36.0–46.0)
Hemoglobin: 12.7 g/dL (ref 12.0–15.0)
MCH: 23.8 pg — ABNORMAL LOW (ref 26.0–34.0)
MCHC: 30.2 g/dL (ref 30.0–36.0)
MCV: 78.8 fL — ABNORMAL LOW (ref 80.0–100.0)
Platelets: 199 10*3/uL (ref 150–400)
RBC: 5.33 MIL/uL — ABNORMAL HIGH (ref 3.87–5.11)
RDW: 14 % (ref 11.5–15.5)
WBC: 5.3 10*3/uL (ref 4.0–10.5)
nRBC: 0 % (ref 0.0–0.2)

## 2019-06-09 LAB — ANTINUCLEAR ANTIBODIES, IFA: ANA Ab, IFA: NEGATIVE

## 2019-06-09 LAB — ANGIOTENSIN CONVERTING ENZYME: Angiotensin-Converting Enzyme: 47 U/L (ref 14–82)

## 2019-06-09 LAB — RPR: RPR Ser Ql: NONREACTIVE

## 2019-06-09 MED ORDER — IBUPROFEN 200 MG PO TABS
800.0000 mg | ORAL_TABLET | Freq: Once | ORAL | Status: AC
  Administered 2019-06-09: 800 mg via ORAL
  Filled 2019-06-09: qty 4

## 2019-06-09 MED ORDER — PANTOPRAZOLE SODIUM 40 MG PO TBEC
40.0000 mg | DELAYED_RELEASE_TABLET | Freq: Every day | ORAL | Status: DC
  Administered 2019-06-09 – 2019-06-13 (×5): 40 mg via ORAL
  Filled 2019-06-09 (×5): qty 1

## 2019-06-09 MED ORDER — GADOBUTROL 1 MMOL/ML IV SOLN
10.0000 mL | Freq: Once | INTRAVENOUS | Status: AC | PRN
  Administered 2019-06-09: 13:00:00 10 mL via INTRAVENOUS

## 2019-06-09 NOTE — Evaluation (Signed)
Physical Therapy Evaluation and Discharge   Patient Details Name: Kaitlyn Ashley MRN: [id number] DOB: Sep 12, 1970 Today's Date: 06/09/2019   History of Present Illness  49 yo female sudden onset and quick progression of ascending paresthesias/burning and decreased sensation of bilateral legs along with a left arm which plateaued last Monday 4/19 MRI suspect GBS per neurology notes PMH R foot neuropathy, HTN Thalassemia  Clinical Impression   Patient evaluated by Physical Therapy with no further acute PT needs identified. All education has been completed and the patient has no further questions. Continue to recommend OT evaluation for energy conservation education and to assess LUE. PT is signing off. Thank you for this referral.     Follow Up Recommendations No PT follow up    Equipment Recommendations  None recommended by PT    Recommendations for Other Services OT consult     Precautions / Restrictions Precautions Precautions: Fall      Mobility  Bed Mobility Overal bed mobility: Independent                Transfers Overall transfer level: Independent                  Ambulation/Gait Ambulation/Gait assistance: Supervision;Independent Gait Distance (Feet): 180 Feet Assistive device: None Gait Pattern/deviations: WFL(Within Functional Limits)   Gait velocity interpretation: >2.62 ft/sec, indicative of community ambulatory General Gait Details: noted rt sock gradually working down and becoming loose at toes; discussed with pt appropriate footwear for safety and to avoid slides or flipflops until toes stronger  Stairs            Wheelchair Mobility    Modified Rankin (Stroke Patients Only)       Balance Overall balance assessment: Independent                               Standardized Balance Assessment Standardized Balance Assessment : Berg Balance Test Berg Balance Test Sit to Stand: Able to stand without using hands and  stabilize independently Standing Unsupported: Able to stand safely 2 minutes Sitting with Back Unsupported but Feet Supported on Floor or Stool: Able to sit safely and securely 2 minutes Stand to Sit: Sits safely with minimal use of hands Transfers: Able to transfer safely, minor use of hands Standing Unsupported with Eyes Closed: Able to stand 10 seconds safely Standing Ubsupported with Feet Together: Able to place feet together independently and stand 1 minute safely From Standing, Reach Forward with Outstretched Arm: Can reach confidently >25 cm (10") From Standing Position, Turn to Look Behind Over each Shoulder: Looks behind from both sides and weight shifts well Standing Unsupported, One Foot in Front: Able to plae foot ahead of the other independently and hold 30 seconds         Pertinent Vitals/Pain Pain Assessment: 0-10 Pain Score: 3  Pain Location: legs and feet Pain Descriptors / Indicators: Aching;Burning Pain Intervention(s): Limited activity within patient's tolerance;Monitored during session    Home Living Family/patient expects to be discharged to:: Private residence Living Arrangements: Spouse/significant other;Children Available Help at Discharge: Family;Available PRN/intermittently Type of Home: Other(Comment)(townhouse) Home Access: Stairs to enter Entrance Stairs-Rails: None Entrance Stairs-Number of Steps: 1 Home Layout: Multi-level;Bed/bath upstairs Home Equipment: None      Prior Function Level of Independence: Independent         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; Retail banker Dominance   Dominant Hand: Right  Extremity/Trunk Assessment   Upper Extremity Assessment Upper Extremity Assessment: Defer to OT evaluation    Lower Extremity Assessment Lower Extremity Assessment: RLE deficits/detail;LLE deficits/detail RLE Deficits / Details: WFL except decr toe flexor strength (difficulty keeping sock on her foot when walking and with  testing RLE Sensation: (burning sensation now in legs only; not feet) LLE Sensation: (burning sensation now in legs only; not feet)    Cervical / Trunk Assessment Cervical / Trunk Assessment: Normal  Communication   Communication: No difficulties  Cognition Arousal/Alertness: Awake/alert Behavior During Therapy: WFL for tasks assessed/performed Overall Cognitive Status: Within Functional Limits for tasks assessed                                 General Comments: pt reports began to feel foggy several days PTA but now feels fine      General Comments General comments (skin integrity, edema, etc.): Discussed risk of overdoing activity and causing set back with recovery. Importance of self-monitoring her symptoms and taking breaks/rest as needed. Initiated discussion re: energy conservation and OT eval to follow.     Exercises Other Exercises Other Exercises: educated anticipate gradual recovery of toe flexors with walking, however if remain weak can work on toe curls with washcloth under foot   Assessment/Plan    PT Assessment Patent does not need any further PT services  PT Problem List         PT Treatment Interventions      PT Goals (Current goals can be found in the Care Plan section)  Acute Rehab PT Goals Patient Stated Goal: return home when appropriate PT Goal Formulation: All assessment and education complete, DC therapy    Frequency     Barriers to discharge        Co-evaluation               AM-PAC PT "6 Clicks" Mobility  Outcome Measure Help needed turning from your back to your side while in a flat bed without using bedrails?: None Help needed moving from lying on your back to sitting on the side of a flat bed without using bedrails?: None Help needed moving to and from a bed to a chair (including a wheelchair)?: None Help needed standing up from a chair using your arms (e.g., wheelchair or bedside chair)?: None Help needed to walk in  hospital room?: None Help needed climbing 3-5 steps with a railing? : None 6 Click Score: 24    End of Session   Activity Tolerance: Patient limited by fatigue(legs fatiguing after walk and during Berg) Patient left: in chair;with call bell/phone within reach;with chair alarm set   PT Visit Diagnosis: Other symptoms and signs involving the nervous system (R29.898)    Time: 0630-1601 PT Time Calculation (min) (ACUTE ONLY): 29 min   Charges:   PT Evaluation $PT Eval Low Complexity: 1 Low PT Treatments $Self Care/Home Management: 8-22         Jerolyn Center, PT Pager [phone]   Zena Amos 06/09/2019, 4:39 PM

## 2019-06-09 NOTE — Progress Notes (Signed)
OT Cancellation Note  Patient Details Name: AMRAN MALTER MRN: [id number] DOB: 1970-09-29   Cancelled Treatment:    Reason Eval/Treat Not Completed: Patient at procedure or test/ unavailable(at MRI)  Wynona Neat, OTR/L  Acute Rehabilitation Services Pager: [phone] Office: [phone] .  06/09/2019, 3:05 PM

## 2019-06-09 NOTE — Progress Notes (Signed)
PT Cancellation Note  Patient Details Name: Kaitlyn Ashley MRN: [id number] DOB: 09-Nov-1970   Cancelled Treatment:    Reason Eval/Treat Not Completed: Patient at procedure or test/unavailable  Patient off the unit. PT will return as able this p.m.   Jerolyn Center, PT Pager [phone]    Zena Amos 06/09/2019, 12:03 PM

## 2019-06-09 NOTE — Progress Notes (Signed)
NEURO HOSPITALIST PROGRESS NOTE   Subjective: Seen and examined.  Reports less of burning and numbness sensation in legs than prior few days.  Exam: Vitals:   06/09/19 0715 06/09/19 0745  BP: 116/70 117/70  Pulse: 88 78  Resp: 18 16  Temp: 98.2 F (36.8 C) 98.2 F (36.8 C)  SpO2: 100% 100%    Constitutional: Appears well-developed and well-nourished.  Psych: Affect appropriate to situation Eyes: No scleral injection HENT: No OP obstrucion Head: Normocephalic.  Cardiovascular: Normal rate and regular rhythm.  Respiratory: Effort normal, non-labored breathing GI: Soft. No distension. There is no tenderness.  Skin: WDI Neuro: Mental Status: Patient is awake, alert, oriented to person, place, month, year, and situation. Speech-without dysarthria, aphasia, intact naming-repeating-comprehension. Patient is able to give a clear and coherent history. Cranial Nerves: II: Visual Fields are full.  III,IV, VI: EOMI without ptosis or diploplia. Pupils equal, round and reactive to light V: Facial sensation is symmetric to temperature VII: Facial movement is symmetric.  VIII: hearing is intact to voice X: Palat elevates symmetrically XI: Shoulder shrug is symmetric. XII: tongue is midline without atrophy or fasciculations.  Motor: Tone is normal. Bulk is normal. 5/5 bilateral UE.  4+/5 strength was present in b/l lower extremities.  Sensory: Patient has proprioception in her toes.  LT intact today. Deep Tendon Reflexes: 2+ biceps.  2+ knee jerks bilaterally.  Trace left ankle jerk 0 right ankle jerk . Plantars: Toes are downgoing bilaterally.  No Hoffmann sign. Cerebellar: FNF and HKS are intact bilaterally  Pertinent Labs/Diagnostics:  CT Head Wo Contrast  Result Date: 06/07/2019 CLINICAL DATA:  Paresthesias in bilateral lower extremities for 1 week, now LEFT arm. Slight headache since this morning. EXAM: CT HEAD WITHOUT CONTRAST TECHNIQUE: Contiguous  axial images were obtained from the base of the skull through the vertex without intravenous contrast. COMPARISON:  None. FINDINGS: Brain: Ventricles are normal in size. All areas of the brain demonstrate appropriate gray-[place] matter attenuation. There is no mass, hemorrhage, edema or other evidence of acute parenchymal abnormality. No extra-axial hemorrhage. Vascular: No hyperdense vessel or unexpected calcification. Skull: Normal. Negative for fracture or focal lesion. Sinuses/Orbits: No acute finding. Other: None. IMPRESSION: Negative head CT. No intracranial mass, hemorrhage or edema. Electronically Signed   By: Bary Richard M.D.   On: 06/07/2019 14:56   MR Brain W and Wo Contrast  Result Date: 06/08/2019 CLINICAL DATA:  Paresthesias and pain of bilateral lower legs for 1 week, 3 days ago symptoms spread to left arm. EXAM: MRI HEAD WITHOUT AND WITH CONTRAST TECHNIQUE: Multiplanar, multiecho pulse sequences of the brain and surrounding structures were obtained without and with intravenous contrast. CONTRAST:  52mL GADAVIST GADOBUTROL 1 MMOL/ML IV SOLN COMPARISON:  Non-contrast head CT 06/07/2019, report from head CT 06/20/2011 (images unavailable). FINDINGS: Brain: The examination is slightly limited due to susceptibility artifact at the level of the frontoparietal vertex. There is no evidence of acute infarct. No evidence of intracranial mass. No midline shift or extra-axial fluid collection. No chronic intracranial blood products. There are two punctate foci of T2 hyperintensity within the right frontal lobe [place] matter, nonspecific. 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 No other abnormal intracranial enhancement is identified. Cerebral volume is normal for age. Vascular: Flow voids maintained within the proximal large arterial vessels. Skull and upper cervical spine: No focal marrow lesion.  Sinuses/Orbits: Visualized orbits demonstrate no acute  abnormality. Moderate-sized left maxillary sinus mucous retention cyst. Background mild paranasal sinus mucosal thickening. No significant mastoid effusion IMPRESSION: 1. Exam slightly limited by susceptibility artifact at the level of the frontoparietal vertex. 2. No evidence of acute intracranial abnormality. 3. Two nonspecific punctate foci of T2 hyperintensity within the right frontal lobe Little matter. 4. 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 5. Moderate-sized left maxillary sinus mucous retention cyst with mild background paranasal sinus mucosal thickening. Electronically Signed   By: Jackey Loge DO   On: 06/08/2019 11:39   MR Cervical Spine W or Wo Contrast  Result Date: 06/07/2019 CLINICAL DATA:  Left arm tingling with bilateral lower extremity swelling EXAM: MRI CERVICAL AND THORACIC WITHOUT AND WITH CONTRAST TECHNIQUE: Multiplanar and multiecho pulse sequences of the cervical and thoracic spine were obtained without and with intravenous contrast. CONTRAST:  7.59mL GADAVIST GADOBUTROL 1 MMOL/ML IV SOLN COMPARISON:  None. FINDINGS: MRI CERVICAL SPINE FINDINGS Alignment: Straightening of normal cervical lordosis. Normal alignment. Vertebrae: No fracture, evidence of discitis, or bone lesion. Cord:  Normal signal and morphology. Paraspinal and other soft tissues: Negative Disc levels: C1-C2: Unremarkable. C2-C3: Left uncovertebral hypertrophy with mild disc bulge. There is no spinal canal stenosis. Mild left neural foraminal stenosis. C3-C4: Disc bulge with bilateral uncovertebral hypertrophy. Mild spinal canal stenosis. Moderate bilateral neural foraminal stenosis. C4-C5: There is an intermediate disc bulge with bilateral uncovertebral hypertrophy. Small left subarticular protrusion. Moderate spinal canal stenosis. Severe right and moderate left neural foraminal stenosis. C5-C6: Disc bulge with uncovertebral hypertrophy. Mild spinal canal stenosis. Moderate bilateral neural  foraminal stenosis. C6-C7: Disc bulge with uncovertebral hypertrophy, right worse than left. Mild spinal canal stenosis. Severe right and mild left neural foraminal stenosis. C7-T1: Normal disc space and facet joints. There is no spinal canal stenosis. No neural foraminal stenosis. MRI THORACIC SPINE FINDINGS Alignment: Normal Vertebrae: Normal Spinal cord: Normal Paraspinal soft tissues: Negative Disc levels: No disc herniation or stenosis. IMPRESSION: 1. No acute abnormality of the cervical or thoracic spine. 2. 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 3. Multilevel moderate to severe cervical neural foraminal stenosis. 4. Normal MRI of the thoracic spine. Electronically Signed   By: Deatra Robinson M.D.   On: 06/07/2019 19:16   MR THORACIC SPINE W WO CONTRAST  Result Date: 06/07/2019 CLINICAL DATA:  Left arm tingling with bilateral lower extremity swelling EXAM: MRI CERVICAL AND THORACIC WITHOUT AND WITH CONTRAST TECHNIQUE: Multiplanar and multiecho pulse sequences of the cervical and thoracic spine were obtained without and with intravenous contrast. CONTRAST:  7.71mL GADAVIST GADOBUTROL 1 MMOL/ML IV SOLN COMPARISON:  None. FINDINGS: MRI CERVICAL SPINE FINDINGS Alignment: Straightening of normal cervical lordosis. Normal alignment. Vertebrae: No fracture, evidence of discitis, or bone lesion. Cord:  Normal signal and morphology. Paraspinal and other soft tissues: Negative Disc levels: C1-C2: Unremarkable. C2-C3: Left uncovertebral hypertrophy with mild disc bulge. There is no spinal canal stenosis. Mild left neural foraminal stenosis. C3-C4: Disc bulge with bilateral uncovertebral hypertrophy. Mild spinal canal stenosis. Moderate bilateral neural foraminal stenosis. C4-C5: There is an intermediate disc bulge with bilateral uncovertebral hypertrophy. Small left subarticular protrusion. Moderate spinal canal stenosis. Severe right and moderate left neural foraminal stenosis.  C5-C6: Disc bulge with uncovertebral hypertrophy. Mild spinal canal stenosis. Moderate bilateral neural foraminal stenosis. C6-C7: Disc bulge with uncovertebral hypertrophy, right worse than left. Mild spinal canal stenosis. Severe right and mild left neural foraminal stenosis. C7-T1: Normal disc space and  facet joints. There is no spinal canal stenosis. No neural foraminal stenosis. MRI THORACIC SPINE FINDINGS Alignment: Normal Vertebrae: Normal Spinal cord: Normal Paraspinal soft tissues: Negative Disc levels: No disc herniation or stenosis. IMPRESSION: 1. No acute abnormality of the cervical or thoracic spine. 2. 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 3. Multilevel moderate to severe cervical neural foraminal stenosis. 4. Normal MRI of the thoracic spine. Electronically Signed   By: Ulyses Jarred M.D.   On: 06/07/2019 19:16   Assessment:  This is a 50 year old female with sudden onset and quick progression of ascending paresthesias/burning and decreased sensation of bilateral legs along with a left arm which plateaued last Monday.  Currently still having same symptoms, with some improvement today.  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  Started on IVIG yesterday. Received Covid vaccine a few days before the symptoms started-unclear if there is any association. Have reached out to ID specialist on call to see if this is a reportable side effect.  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 Agree with the history and physical as documented above. Agree with the plan as documented, which I helped formulate. I have independently reviewed the chart, obtained history, review of systems and examined the patient.I have personally reviewed pertinent head/neck/spine imaging (CT/MRI). Please feel free to call with any questions. --- Amie Portland, MD [facility] Pager: [phone]  If 7pm to 7am, please call on call as listed on AMION.

## 2019-06-09 NOTE — Progress Notes (Signed)
Confirmed with Lydia Guiles on IV team that telemetry is not necessary for IVIG protocol. On call MD notified.

## 2019-06-09 NOTE — Progress Notes (Signed)
PROGRESS NOTE    Kaitlyn Ashley  BSJ:[id number] DOB: 03/10/1970 DOA: 06/07/2019 PCP: Benito Mccreedy, MD   Brief Narrative:  49 year old with history of HTN, thalassemia presented with bilateral lower extremity weakness, numbness.  She received initial dose of Covid vaccine 4/9 then developed her symptoms started on 4/12.  Transferred here from [facility] for further evaluation.  CT head negative.  MRI brain-unremarkable.  LP performed concerns for Guillain-Barr syndrome started on IVIG.   Assessment & Plan:   Principal Problem:   Numbness and tingling Active Problems:   Thalassemia   Hypertension   Morbid obesity with BMI of 40.0-44.9, adult (HCC)   Numbness and tingling of both feet  Bilateral lower extremity numbness and tingling Guillain-Barr syndrome? -Seen by neurology.  Perform CT head, MRI brain, cervical/thoracic spine and LP 4/19.  No acute abnormality seen besides cervical canal foraminal stenosis, normal thoracic spine.  LP showed albumin no cytologic dissociation -Started on IVIG X 5 days -Follow symptomatically -PT/OT -Supportive care -B12-normal, folate, RPR -CRP and ESR normal, TSH normal  Essential hypertension -Not on any medications at this time  History of thalassemia -Continue to follow hemoglobin  History of migraines -On Topamax  Morbid obesity with BMI greater than 42    DVT prophylaxis: Lovenox Code Status: Full code Family Communication: None Disposition Plan:   Patient From= home  Patient Anticipated D/C place= to be determined  Barriers= maintain hospital stay for IVIG infusion    Subjective: Seen and examined at bedside.  Tells me left upper extremity numbness is resolved.  Bilateral lower extreme numbness is improved and now has some mild cramping.  No other complaints.  Feels slightly better today compared to yesterday.  Review of Systems Otherwise negative except as per HPI, including: General: Denies fever, chills, night  sweats or unintended weight loss. Resp: Denies cough, wheezing, shortness of breath. Cardiac: Denies chest pain, palpitations, orthopnea, paroxysmal nocturnal dyspnea. GI: Denies abdominal pain, nausea, vomiting, diarrhea or constipation GU: Denies dysuria, frequency, hesitancy or incontinence MS: Denies muscle aches, joint pain or swelling Neuro: Denies headache Psych: Denies anxiety, depression, SI/HI/AVH Skin: Denies new rashes or lesions ID: Denies sick contacts, exotic exposures, travel Examination:  Constitutional: Not in acute distress Respiratory: Clear to auscultation bilaterally Cardiovascular: Normal sinus rhythm, no rubs Abdomen: Nontender nondistended good bowel sounds Musculoskeletal: No edema noted Skin: No rashes seen Neurologic: Good strength in all 4 extremities.  All cranial nerves intact.  Good sensation in all 4 extremities as well. Psychiatric: Normal judgment and insight. Alert and oriented x 3. Normal mood.   Objective: Vitals:   06/09/19 0000 06/09/19 0400 06/09/19 0715 06/09/19 0745  BP: 111/66 120/74 116/70 117/70  Pulse: 70 82 88 78  Resp:   18 16  Temp: 98 F (36.7 C) 98.2 F (36.8 C) 98.2 F (36.8 C) 98.2 F (36.8 C)  TempSrc: Oral Oral Oral Oral  SpO2: 100% 100% 100% 100%  Weight:      Height:       No intake or output data in the 24 hours ending 06/09/19 0831 Filed Weights   06/07/19 1332 06/08/19 1030  Weight: 112.5 kg 112.5 kg     Data Reviewed:   CBC: Recent Labs  Lab 06/07/19 1427 06/09/19 0454  WBC 5.7 5.3  HGB 14.2 12.7  HCT 45.9 42.0  MCV 79.0* 78.8*  PLT 230 765   Basic Metabolic Panel: Recent Labs  Lab 06/07/19 1427 06/09/19 0454  NA 138 136  K 3.2* 3.4*  CL 101 105  CO2 27 23  GLUCOSE 95 107*  BUN 12 15  CREATININE 1.05* 1.01*  CALCIUM 9.0 8.6*  MG 2.1  --    GFR: Estimated Creatinine Clearance: 85.5 mL/min (A) (by C-G formula based on SCr of 1.01 mg/dL (H)). Liver Function Tests: No results for  input(s): AST, ALT, ALKPHOS, BILITOT, PROT, ALBUMIN in the last 168 hours. No results for input(s): LIPASE, AMYLASE in the last 168 hours. No results for input(s): AMMONIA in the last 168 hours. Coagulation Profile: No results for input(s): INR, PROTIME in the last 168 hours. Cardiac Enzymes: No results for input(s): CKTOTAL, CKMB, CKMBINDEX, TROPONINI in the last 168 hours. BNP (last 3 results) No results for input(s): PROBNP in the last 8760 hours. HbA1C: No results for input(s): HGBA1C in the last 72 hours. CBG: No results for input(s): GLUCAP in the last 168 hours. Lipid Profile: No results for input(s): CHOL, HDL, LDLCALC, TRIG, CHOLHDL, LDLDIRECT in the last 72 hours. Thyroid Function Tests: Recent Labs    06/07/19 1626  TSH 2.241   Anemia Panel: Recent Labs    06/07/19 1626  VITAMINB12 847  FOLATE 21.8   Sepsis Labs: No results for input(s): PROCALCITON, LATICACIDVEN in the last 168 hours.  Recent Results (from the past 240 hour(s))  SARS CORONAVIRUS 2 (TAT 6-24 HRS) Nasopharyngeal Nasopharyngeal Swab     Status: None   Collection Time: 06/07/19  8:35 PM   Specimen: Nasopharyngeal Swab  Result Value Ref Range Status   SARS Coronavirus 2 NEGATIVE NEGATIVE Final    Comment: (NOTE) SARS-CoV-2 target nucleic acids are NOT DETECTED. The SARS-CoV-2 RNA is generally detectable in upper and lower respiratory specimens during the acute phase of infection. Negative results do not preclude SARS-CoV-2 infection, do not rule out co-infections with other pathogens, and should not be used as the sole basis for treatment or other patient management decisions. Negative results must be combined with clinical observations, patient history, and epidemiological information. The expected result is Negative. Fact Sheet for Patients: SugarRoll.be Fact Sheet for Healthcare Providers: https://www.woods-mathews.com/ This test is not yet approved  or cleared by the [place] FDA and  has been authorized for detection and/or diagnosis of SARS-CoV-2 by FDA under an Emergency Use Authorization (EUA). This EUA will remain  in effect (meaning this test can be used) for the duration of the COVID-19 declaration under Section 56 4(b)(1) of the Act, 21 U.S.C. section 360bbb-3(b)(1), unless the authorization is terminated or revoked sooner. Performed at [facility], [place] [address] [place], [place] [address]          Radiology Studies: CT Head Wo Contrast  Result Date: 06/07/2019 CLINICAL DATA:  Paresthesias in bilateral lower extremities for 1 week, now LEFT arm. Slight headache since this morning. EXAM: CT HEAD WITHOUT CONTRAST TECHNIQUE: Contiguous axial images were obtained from the base of the skull through the vertex without intravenous contrast. COMPARISON:  None. FINDINGS: Brain: Ventricles are normal in size. All areas of the brain demonstrate appropriate gray-[place]saw matter attenuation. There is no mass, hemorrhage, edema or other evidence of acute parenchymal abnormality. No extra-axial hemorrhage. Vascular: No hyperdense vessel or unexpected calcification. Skull: Normal. Negative for fracture or focal lesion. Sinuses/Orbits: No acute finding. Other: None. IMPRESSION: Negative head CT. No intracranial mass, hemorrhage or edema. Electronically Signed   By: Franki Cabot M.D.   On: 06/07/2019 14:56   MR Brain W and Wo Contrast  Result Date: 06/08/2019 CLINICAL DATA:  Paresthesias and pain  of bilateral lower legs for 1 week, 3 days ago symptoms spread to left arm. EXAM: MRI HEAD WITHOUT AND WITH CONTRAST TECHNIQUE: Multiplanar, multiecho pulse sequences of the brain and surrounding structures were obtained without and with intravenous contrast. CONTRAST:  70m GADAVIST GADOBUTROL 1 MMOL/ML IV SOLN COMPARISON:  Non-contrast head CT 06/07/2019, report from head CT 06/20/2011 (images unavailable). FINDINGS: Brain: The examination is  slightly limited due to susceptibility artifact at the level of the frontoparietal vertex. There is no evidence of acute infarct. No evidence of intracranial mass. No midline shift or extra-axial fluid collection. No chronic intracranial blood products. There are two punctate foci of T2 hyperintensity within the right frontal lobe [place] matter, nonspecific. 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 No other abnormal intracranial enhancement is identified. Cerebral volume is normal for age. Vascular: Flow voids maintained within the proximal large arterial vessels. Skull and upper cervical spine: No focal marrow lesion. Sinuses/Orbits: Visualized orbits demonstrate no acute abnormality. Moderate-sized left maxillary sinus mucous retention cyst. Background mild paranasal sinus mucosal thickening. No significant mastoid effusion IMPRESSION: 1. Exam slightly limited by susceptibility artifact at the level of the frontoparietal vertex. 2. No evidence of acute intracranial abnormality. 3. Two nonspecific punctate foci of T2 hyperintensity within the right frontal lobe [place] matter. 4. 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 5. Moderate-sized left maxillary sinus mucous retention cyst with mild background paranasal sinus mucosal thickening. Electronically Signed   By: KKellie SimmeringDO   On: 06/08/2019 11:39   MR Cervical Spine W or Wo Contrast  Result Date: 06/07/2019 CLINICAL DATA:  Left arm tingling with bilateral lower extremity swelling EXAM: MRI CERVICAL AND THORACIC WITHOUT AND WITH CONTRAST TECHNIQUE: Multiplanar and multiecho pulse sequences of the cervical and thoracic spine were obtained without and with intravenous contrast. CONTRAST:  7.586mGADAVIST GADOBUTROL 1 MMOL/ML IV SOLN COMPARISON:  None. FINDINGS: MRI CERVICAL SPINE FINDINGS Alignment: Straightening of normal cervical lordosis. Normal alignment. Vertebrae: No fracture, evidence  of discitis, or bone lesion. Cord:  Normal signal and morphology. Paraspinal and other soft tissues: Negative Disc levels: C1-C2: Unremarkable. C2-C3: Left uncovertebral hypertrophy with mild disc bulge. There is no spinal canal stenosis. Mild left neural foraminal stenosis. C3-C4: Disc bulge with bilateral uncovertebral hypertrophy. Mild spinal canal stenosis. Moderate bilateral neural foraminal stenosis. C4-C5: There is an intermediate disc bulge with bilateral uncovertebral hypertrophy. Small left subarticular protrusion. Moderate spinal canal stenosis. Severe right and moderate left neural foraminal stenosis. C5-C6: Disc bulge with uncovertebral hypertrophy. Mild spinal canal stenosis. Moderate bilateral neural foraminal stenosis. C6-C7: Disc bulge with uncovertebral hypertrophy, right worse than left. Mild spinal canal stenosis. Severe right and mild left neural foraminal stenosis. C7-T1: Normal disc space and facet joints. There is no spinal canal stenosis. No neural foraminal stenosis. MRI THORACIC SPINE FINDINGS Alignment: Normal Vertebrae: Normal Spinal cord: Normal Paraspinal soft tissues: Negative Disc levels: No disc herniation or stenosis. IMPRESSION: 1. No acute abnormality of the cervical or thoracic spine. 2. 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 3. Multilevel moderate to severe cervical neural foraminal stenosis. 4. Normal MRI of the thoracic spine. Electronically Signed   By: KeUlyses Jarred.D.   On: 06/07/2019 19:16   MR THORACIC SPINE W WO CONTRAST  Result Date: 06/07/2019 CLINICAL DATA:  Left arm tingling with bilateral lower extremity swelling EXAM: MRI CERVICAL AND THORACIC WITHOUT AND WITH CONTRAST TECHNIQUE: Multiplanar and multiecho pulse sequences of the cervical and thoracic  spine were obtained without and with intravenous contrast. CONTRAST:  7.23m GADAVIST GADOBUTROL 1 MMOL/ML IV SOLN COMPARISON:  None. FINDINGS: MRI CERVICAL SPINE FINDINGS  Alignment: Straightening of normal cervical lordosis. Normal alignment. Vertebrae: No fracture, evidence of discitis, or bone lesion. Cord:  Normal signal and morphology. Paraspinal and other soft tissues: Negative Disc levels: C1-C2: Unremarkable. C2-C3: Left uncovertebral hypertrophy with mild disc bulge. There is no spinal canal stenosis. Mild left neural foraminal stenosis. C3-C4: Disc bulge with bilateral uncovertebral hypertrophy. Mild spinal canal stenosis. Moderate bilateral neural foraminal stenosis. C4-C5: There is an intermediate disc bulge with bilateral uncovertebral hypertrophy. Small left subarticular protrusion. Moderate spinal canal stenosis. Severe right and moderate left neural foraminal stenosis. C5-C6: Disc bulge with uncovertebral hypertrophy. Mild spinal canal stenosis. Moderate bilateral neural foraminal stenosis. C6-C7: Disc bulge with uncovertebral hypertrophy, right worse than left. Mild spinal canal stenosis. Severe right and mild left neural foraminal stenosis. C7-T1: Normal disc space and facet joints. There is no spinal canal stenosis. No neural foraminal stenosis. MRI THORACIC SPINE FINDINGS Alignment: Normal Vertebrae: Normal Spinal cord: Normal Paraspinal soft tissues: Negative Disc levels: No disc herniation or stenosis. IMPRESSION: 1. No acute abnormality of the cervical or thoracic spine. 2. 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 3. Multilevel moderate to severe cervical neural foraminal stenosis. 4. Normal MRI of the thoracic spine. Electronically Signed   By: KUlyses JarredM.D.   On: 06/07/2019 19:16        Scheduled Meds: . docusate sodium  100 mg Oral BID  . enoxaparin (LOVENOX) injection  40 mg Subcutaneous Q24H  . topiramate  100 mg Oral BID   Continuous Infusions: . Immune Globulin 10%       LOS: 1 day   Time spent= 35 mins    Ankit CArsenio Loader MD [facility]  If 7PM-7AM, please contact  night-coverage  06/09/2019, 8:31 AM

## 2019-06-09 NOTE — Progress Notes (Signed)
Page out to MD on call with request for reorder of telemetry for IVIG protocol. Awaiting response.

## 2019-06-10 DIAGNOSIS — I10 Essential (primary) hypertension: Secondary | ICD-10-CM | POA: Diagnosis not present

## 2019-06-10 DIAGNOSIS — R200 Anesthesia of skin: Secondary | ICD-10-CM | POA: Diagnosis not present

## 2019-06-10 DIAGNOSIS — G610 Guillain-Barre syndrome: Secondary | ICD-10-CM | POA: Diagnosis not present

## 2019-06-10 DIAGNOSIS — R202 Paresthesia of skin: Secondary | ICD-10-CM | POA: Diagnosis not present

## 2019-06-10 LAB — COPPER, SERUM: Copper: 167 ug/dL — ABNORMAL HIGH (ref 80–158)

## 2019-06-10 NOTE — Progress Notes (Addendum)
Neurology progress note  Subjective: No acute changes. Reports improvement in lower extremity tingling. Has received 2 doses of IVIG  Objective: Essentially unchanged exam from yesterday. Awake alert oriented x3 No dysarthria No aphasia Cranial nerves II to XII intact.  No ophthalmoplegia is ophthalmoplegia. Motor exam: Essentially 5/5 in the upper extremities.  4+/5-5/5 in the lower extremities. Sensory exam: Slightly diminished sensation in stocking pattern with some proprioceptive loss in bilateral lower extremities as well. Coordination: Intact Gait testing deferred at this time  Labs: CSF protein 70, glucose normal, 2 Gaxi[place] cells. B12 normal Copper mildly elevated CRP elevated at 1.7.  L-spine MRI with and without contrast does not show any nerve root enhancement.  It does show degenerative disc disease at L4-5 with resultant moderate canal stenosis and mild right subarticular recess stenosis and mild right foraminal stenosis.  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 -She did receive the vaccine a few days prior to symptom onset but there is no definitive literature linking COVID-19 vaccine to Guillain-Barr syndrome-it has been linked to actual COVID-19 infection though. -I discussed this case with an [place] neuromuscular expert, who also agreed with me that there is not enough information on whether the vaccination is associated with the syndrome and we should also look for other causes of similar presentations. -It would be great if we were able to get EMG nerve conduction studies inpatient, but unfortunately those facilities are not available to [place] here.  I am going to order ANA, rheumatoid factor, SSA/SSB and in the CSF, I will add on GD1B and GM1 antibodies. -Patient also asked me if she should receive her second Covid vaccine which is due on April 30.  I asked her to hold off on that for at least a couple of  weeks.  I will also try to reach out to infectious disease specialist to get their input on this. -Continue for a total of 5 doses of IVIG as previously recommended.  Discussed my plan with Dr. Sharon Seller over the phone.  -- Milon Dikes, MD [facility] Pager: [phone]

## 2019-06-10 NOTE — Progress Notes (Signed)
Kaitlyn Ashley  KXF:[id number] DOB: 1970-09-15 DOA: 06/07/2019 PCP: Benito Mccreedy, MD    Brief Narrative:  71CV with a history of HTN and thalassemia who presented with the acute onset of bilateral lower extremity weakness and numbness.  She received her first dose of the Covid vaccine 4/9.  Her symptoms began 4/12.  At the time of initial evaluation her CT head was negative.  An MRI brain was also unremarkable.  Significant Events: 4/19 admit via [place]  4/19 LP  Antimicrobials:  None  Subjective: In good spirits resting comfortably in bed.  States that she feels her lower extremity strength and numbness are improving.  Denies chest pain nausea vomiting or abdominal pain.  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   DVT prophylaxis: Lovenox Code Status: FULL CODE Family Communication: Spoke with mother at bedside Disposition Plan: To complete IVIG course -living independently prior to admission with expectation that she will be able to be discharged in the same environment when medically stable  Consultants:  none  Objective: Blood pressure 112/76, pulse 87, temperature 98 F (36.7 C), temperature source Oral, resp. rate 20, height 5\' 4"  (1.626 m), weight 112.5 kg, SpO2 99 %.  Intake/Output Summary (Last 24 hours) at 06/10/2019 0934 Last data filed at 06/09/2019 1846 Gross per 24 hour  Intake 150 ml  Output --  Net 150 ml   Filed Weights   06/07/19 1332 06/08/19 1030  Weight: 112.5 kg 112.5 kg    Examination: General: No acute respiratory  distress Lungs: Clear to auscultation bilaterally without wheezes or crackles Cardiovascular: Regular rate and rhythm without murmur gallop or rub normal S1 and S2 Abdomen: Nontender, nondistended, soft, bowel sounds positive, no rebound, no ascites, no appreciable mass Extremities: No significant cyanosis, clubbing, or edema bilateral lower extremities  CBC: Recent Labs  Lab 06/07/19 1427 06/09/19 0454  WBC 5.7 5.3  HGB 14.2 12.7  HCT 45.9 42.0  MCV 79.0* 78.8*  PLT 230 893   Basic Metabolic Panel: Recent Labs  Lab 06/07/19 1427 06/09/19 0454  NA 138 136  K 3.2* 3.4*  CL 101 105  CO2 27 23  GLUCOSE 95 107*  BUN 12 15  CREATININE 1.05* 1.01*  CALCIUM 9.0 8.6*  MG 2.1  --    GFR: Estimated Creatinine Clearance: 85.5 mL/min (A) (by C-G formula based on SCr of 1.01 mg/dL (H)).  Liver Function Tests: No results for input(s): AST, ALT, ALKPHOS, BILITOT, PROT, ALBUMIN in the last 168 hours. No results for input(s): LIPASE, AMYLASE in the last 168 hours. No results for input(s): AMMONIA in the last 168 hours.  Coagulation Profile: No results for input(s): INR, PROTIME in the last 168 hours.  Cardiac Enzymes: No results for input(s): CKTOTAL, CKMB, CKMBINDEX, TROPONINI in the last 168 hours.  HbA1C: No results found for: HGBA1C  CBG: No results for input(s): GLUCAP in the last 168 hours.  Recent Results (from the past 240 hour(s))  SARS CORONAVIRUS 2 (TAT 6-24 HRS) Nasopharyngeal Nasopharyngeal Swab     Status: None   Collection Time: 06/07/19  8:35 PM   Specimen: Nasopharyngeal  Swab  Result Value Ref Range Status   SARS Coronavirus 2 NEGATIVE NEGATIVE Final    Comment: (NOTE) SARS-CoV-2 target nucleic acids are NOT DETECTED. The SARS-CoV-2 RNA is generally detectable in upper and lower respiratory specimens during the acute phase of infection. Negative results do not preclude SARS-CoV-2 infection, do not rule out co-infections with other pathogens, and should  not be used as the sole basis for treatment or other patient management decisions. Negative results must be combined with clinical observations, patient history, and epidemiological information. The expected result is Negative. Fact Sheet for Patients: HairSlick.no Fact Sheet for Healthcare Providers: quierodirigir.com This test is not yet approved or cleared by the [place] FDA and  has been authorized for detection and/or diagnosis of SARS-CoV-2 by FDA under an Emergency Use Authorization (EUA). This EUA will remain  in effect (meaning this test can be used) for the duration of the COVID-19 declaration under Section 56 4(b)(1) of the Act, 21 U.S.C. section 360bbb-3(b)(1), unless the authorization is terminated or revoked sooner. Performed at [facility], [address] [place], [place] [address]      Scheduled Meds: . docusate sodium  100 mg Oral BID  . enoxaparin (LOVENOX) injection  40 mg Subcutaneous Q24H  . pantoprazole  40 mg Oral QAC breakfast  . topiramate  100 mg Oral BID   Continuous Infusions: . Immune Globulin 10% 45 g (06/09/19 2045)     LOS: 2 days   Lonia Blood, MD [facility] Office  [phone] Pager - Text Page per Loretha Stapler  If 7PM-7AM, please contact night-coverage per Amion 06/10/2019, 9:34 AM

## 2019-06-10 NOTE — Progress Notes (Signed)
Occupational Therapy Evaluation Only Patient Details Name: Kaitlyn Ashley MRN: [id number] DOB: 09-29-1970 Today's Date: 06/10/2019    History of Present Illness 49 yo female sudden onset and quick progression of ascending paresthesias/burning and decreased sensation of bilateral legs along with a left arm which plateaued last Monday 4/19 MRI suspect GBS per neurology notes PMH R foot neuropathy, HTN Thalassemia   Clinical Impression   PTA pt lived with significant other and son. Pt is independent in all ADL, IADL, and mobility tasks. Pt does not ambulate with an assistive device and reports 0 falls in the last 6 months. Pt ambulated around room and unit independently without an AD, noting 0 instances of LOB. Pt independent in LB dressing, toileting, simulated tub/shower transfer, and grooming at the sink. Educated pt on energy conservation techniques and safety strategies with handout provided. No further skilled OT services warranted at this time as pt is functioning at baseline for self-care and functional transfer tasks.     Follow Up Recommendations  No OT follow up    Equipment Recommendations  None recommended by OT    Recommendations for Other Services       Precautions / Restrictions Precautions Precautions: None Restrictions Weight Bearing Restrictions: No      Mobility Bed Mobility Overal bed mobility: Independent             General bed mobility comments: HOB flat, without use of bedrails  Transfers Overall transfer level: Independent Equipment used: None             General transfer comment: Pt ambulated around room and unit independently with 0 instances of LOB.     Balance Overall balance assessment: Independent                                         ADL either performed or assessed with clinical judgement   ADL Overall ADL's : Independent                                       General ADL Comments: Pt able to  ambulate around room independently, noting 0 instances of LOB. Pt completed LB dressing, toileting, simulated tub/shower transfer, and grooming at the sink independently.      Vision Baseline Vision/History: Wears glasses Wears Glasses: At all times       Perception     Praxis      Pertinent Vitals/Pain Pain Assessment: 0-10 Pain Score: 2  Pain Location: Right thigh Pain Descriptors / Indicators: Numbness Pain Intervention(s): Monitored during session     Hand Dominance Right   Extremity/Trunk Assessment Upper Extremity Assessment Upper Extremity Assessment: Overall WFL for tasks assessed(BUE ROM WFL, strength equal, intact sensation. )   Lower Extremity Assessment Lower Extremity Assessment: Defer to PT evaluation       Communication Communication Communication: No difficulties   Cognition Arousal/Alertness: Awake/alert Behavior During Therapy: WFL for tasks assessed/performed Overall Cognitive Status: Within Functional Limits for tasks assessed                                 General Comments: A&Ox4. Pt pleasant and willing to participate in therapy tasks. Pt able to follow multi-step instructions without difficulty.    General Comments  Discussed energy conservation techniques with handout provided.     Exercises     Shoulder Instructions      Home Living Family/patient expects to be discharged to:: Private residence Living Arrangements: Spouse/significant other;Children Available Help at Discharge: Family;Available PRN/intermittently Type of Home: Other(Comment)(townhouse) Home Access: Stairs to enter Entrance Stairs-Number of Steps: 1 Entrance Stairs-Rails: None Home Layout: Multi-level;Bed/bath upstairs Alternate Level Stairs-Number of Steps: 12 Alternate Level Stairs-Rails: Right Bathroom Shower/Tub: Chief Strategy Officer: Standard     Home Equipment: None          Prior Functioning/Environment Level of  Independence: Independent        Comments: Pt independent in all ADL, IADL, and mobility tasks. Pt does not ambulate with an AD and reports 0 falls in the last 6 months. 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         OT Problem List:        OT Treatment/Interventions:      OT Goals(Current goals can be found in the care plan section)    OT Frequency:     Barriers to D/C:            Co-evaluation              AM-PAC OT "6 Clicks" Daily Activity     Outcome Measure Help from another person eating meals?: None Help from another person taking care of personal grooming?: None Help from another person toileting, which includes using toliet, bedpan, or urinal?: None Help from another person bathing (including washing, rinsing, drying)?: None Help from another person to put on and taking off regular upper body clothing?: None Help from another person to put on and taking off regular lower body clothing?: None 6 Click Score: 24   End of Session Nurse Communication: Mobility status  Activity Tolerance: Patient tolerated treatment well Patient left: in chair;with call bell/phone within reach;with family/visitor present  OT Visit Diagnosis: Muscle weakness (generalized) (M62.81)                Time: 2633-3545 OT Time Calculation (min): 16 min Charges:  OT General Charges $OT Visit: 1 Visit OT Evaluation $OT Eval Low Complexity: 1 Low  Peterson Ao OTR/L [phone]  Peterson Ao 06/10/2019, 8:27 AM

## 2019-06-11 LAB — MISC LABCORP TEST (SEND OUT)

## 2019-06-11 LAB — RHEUMATOID FACTOR: Rheumatoid fact SerPl-aCnc: <10 IU/mL (ref 0.0–13.9)

## 2019-06-11 LAB — SJOGRENS SYNDROME-B EXTRACTABLE NUCLEAR ANTIBODY: SSB (La) (ENA) Antibody, IgG: <0.2 AI (ref 0.0–0.9)

## 2019-06-11 LAB — SJOGRENS SYNDROME-A EXTRACTABLE NUCLEAR ANTIBODY: SSA (Ro) (ENA) Antibody, IgG: 2.1 AI — ABNORMAL HIGH (ref 0.0–0.9)

## 2019-06-11 LAB — ANTINUCLEAR ANTIBODIES, IFA: ANA Ab, IFA: NEGATIVE

## 2019-06-11 MED ORDER — POTASSIUM CHLORIDE CRYS ER 20 MEQ PO TBCR
40.0000 meq | EXTENDED_RELEASE_TABLET | Freq: Once | ORAL | Status: AC
  Administered 2019-06-11: 40 meq via ORAL
  Filled 2019-06-11: qty 2

## 2019-06-11 NOTE — Plan of Care (Signed)
Seen and examined. Subjectively improving. Will follow-continue total 5 doses of IVIG  -- Milon Dikes, MD [facility]

## 2019-06-11 NOTE — Progress Notes (Signed)
Kaitlyn Ashley  IOX:[id number] DOB: [birth year] DOA: 06/07/2019 PCP: Jackie Plum, MD    Brief Narrative:  28ST with a history of HTN and thalassemia who presented with the acute onset of bilateral lower extremity weakness and numbness.  She received her first dose of the Covid vaccine 4/9.  Her symptoms began 4/12.  At the time of initial evaluation her CT head was negative.  An MRI brain was also unremarkable.  Significant Events: 4/19 admit via [place]  4/19 LP  Antimicrobials:  None  Subjective: Feeling much better overall.  States her strength and feeling in her lower extremities continues to improve.  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   DVT prophylaxis: Lovenox Code Status: FULL CODE Family Communication: No family present at time of exam today Disposition Plan: To complete IVIG course -living independently prior to admission with expectation that she will be able to be discharged in the same environment when medically stable  Consultants:  none  Objective: Blood pressure 104/71, pulse 80, temperature 98.1 F (36.7 C), temperature source Oral, resp. rate 18, height 5\' 4"  (1.626 m), weight 112.5 kg, SpO2 100 %.  Intake/Output Summary (Last 24 hours) at 06/11/2019 1021 Last data filed at 06/11/2019 1008 Gross per 24 hour  Intake 240 ml  Output --  Net 240 ml   Filed Weights   06/07/19 1332 06/08/19 1030  Weight: 112.5 kg 112.5 kg    Examination: General: No acute respiratory  distress Lungs: Clear to auscultation bilaterally Cardiovascular: RRR without murmur Extremities: No significant cyanosis, clubbing, or edema bilateral lower extremities  CBC: Recent Labs  Lab 06/07/19 1427 06/09/19 0454  WBC 5.7 5.3  HGB 14.2 12.7  HCT 45.9 42.0  MCV 79.0* 78.8*  PLT 230 199   Basic Metabolic Panel: Recent Labs  Lab 06/07/19 1427 06/09/19 0454  NA 138 136  K 3.2* 3.4*  CL 101 105  CO2 27 23  GLUCOSE 95 107*  BUN 12 15  CREATININE 1.05* 1.01*  CALCIUM 9.0 8.6*  MG 2.1  --    GFR: Estimated Creatinine Clearance: 85.5 mL/min (A) (by C-G formula based on SCr of 1.01 mg/dL (H)).  Liver Function Tests: No results for input(s): AST, ALT, ALKPHOS, BILITOT, PROT, ALBUMIN in the last 168 hours. No results for input(s): LIPASE, AMYLASE in the last 168 hours. No results for input(s): AMMONIA in the last 168 hours.  Coagulation Profile: No results for input(s): INR, PROTIME in the last 168 hours.  Cardiac Enzymes: No results for input(s): CKTOTAL, CKMB, CKMBINDEX, TROPONINI in the last 168 hours.  HbA1C: No results found for: HGBA1C  CBG: No results for input(s): GLUCAP in the last 168 hours.  Recent Results (from the past 240 hour(s))  SARS CORONAVIRUS 2 (TAT 6-24 HRS) Nasopharyngeal Nasopharyngeal Swab     Status: None   Collection Time: 06/07/19  8:35 PM   Specimen: Nasopharyngeal Swab  Result Value Ref Range Status   SARS Coronavirus 2 NEGATIVE NEGATIVE Final    Comment: (NOTE) SARS-CoV-2 target nucleic acids are NOT DETECTED. The SARS-CoV-2 RNA  is generally detectable in upper and lower respiratory specimens during the acute phase of infection. Negative results do not preclude SARS-CoV-2 infection, do not rule out co-infections with other pathogens, and should not be used as the sole basis for treatment or other patient management decisions. Negative results must be combined with clinical observations, patient history, and epidemiological  information. The expected result is Negative. Fact Sheet for Patients: SugarRoll.be Fact Sheet for Healthcare Providers: https://www.woods-mathews.com/ This test is not yet approved or cleared by the [place] FDA and  has been authorized for detection and/or diagnosis of SARS-CoV-2 by FDA under an Emergency Use Authorization (EUA). This EUA will remain  in effect (meaning this test can be used) for the duration of the COVID-19 declaration under Section 56 4(b)(1) of the Act, 21 U.S.C. section 360bbb-3(b)(1), unless the authorization is terminated or revoked sooner. Performed at [facility], [place] [address], [place], [place] [address]      Scheduled Meds: . docusate sodium  100 mg Oral BID  . enoxaparin (LOVENOX) injection  40 mg Subcutaneous Q24H  . pantoprazole  40 mg Oral QAC breakfast  . topiramate  100 mg Oral BID   Continuous Infusions: . Immune Globulin 10% 45 g (06/10/19 2049)     LOS: 3 days   Cherene Altes, MD [facility] Office  [phone] Pager - Text Page per Shea Evans  If 7PM-7AM, please contact night-coverage per Amion 06/11/2019, 10:21 AM

## 2019-06-12 LAB — BASIC METABOLIC PANEL
Anion gap: 4 — ABNORMAL LOW (ref 5–15)
BUN: 11 mg/dL (ref 6–20)
CO2: 21 mmol/L — ABNORMAL LOW (ref 22–32)
Calcium: 8.4 mg/dL — ABNORMAL LOW (ref 8.9–10.3)
Chloride: 109 mmol/L (ref 98–111)
Creatinine, Ser: 0.94 mg/dL (ref 0.44–1.00)
GFR calc Af Amer: >60 mL/min (ref >60)
GFR calc non Af Amer: >60 mL/min (ref >60)
Glucose, Bld: 89 mg/dL (ref 70–99)
Potassium: 3.9 mmol/L (ref 3.5–5.1)
Sodium: 134 mmol/L — ABNORMAL LOW (ref 135–145)

## 2019-06-12 LAB — MAGNESIUM: Magnesium: 1.9 mg/dL (ref 1.7–2.4)

## 2019-06-12 NOTE — Progress Notes (Signed)
Vaccine Adverse Event Reporting System (VAERS) VAERS E-Report number: [id number]  Jt Brabec S. Merilynn Finland, PharmD, BCPS Clinical Staff Pharmacist [email or web address]

## 2019-06-12 NOTE — Progress Notes (Signed)
Kaitlyn Ashley  NIO:[id number] DOB: 1971-02-09 DOA: 06/07/2019 PCP: Benito Mccreedy, MD    Brief Narrative:  83WE with a history of HTN and thalassemia who presented with the acute onset of bilateral lower extremity weakness and numbness.  She received her first dose of the Covid vaccine 4/9.  Her symptoms began 4/12.  At the time of initial evaluation her CT head was negative.  An MRI brain was also unremarkable.  Significant Events: 4/19 admit via [place]  4/19 LP  Antimicrobials:  None  Subjective: Resting comfortably in bed with no new complaints.  Reports that her lower extremity strength is essentially back to normal.  Denies numbness in her legs.  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   DVT prophylaxis: Lovenox Code Status: FULL CODE Family Communication: No family present at time of exam today Disposition Plan: To complete IVIG course -living independently prior to admission with expectation that she will be able to be discharged in the same environment when medically stable - anticipate d/c home Sunday   Consultants:  none  Objective: Blood pressure 114/74, pulse 79, temperature 98.2 F (36.8 C), temperature source Oral, resp. rate 20, height 5\' 4"  (1.626 m), weight 112.5 kg, SpO2 99 %.  Intake/Output Summary (Last 24 hours) at 06/12/2019 0932 Last data filed at 06/12/2019 0000 Gross per 24 hour  Intake 2873 ml  Output -  Net 2873 ml   Filed Weights   06/07/19 1332  06/08/19 1030  Weight: 112.5 kg 112.5 kg    Examination: General: No acute respiratory distress Lungs: Clear to auscultation bilaterally Cardiovascular: RRR without murmur Extremities: No significant cyanosis, clubbing, or edema bilateral lower extremities  CBC: Recent Labs  Lab 06/07/19 1427 06/09/19 0454  WBC 5.7 5.3  HGB 14.2 12.7  HCT 45.9 42.0  MCV 79.0* 78.8*  PLT 230 993   Basic Metabolic Panel: Recent Labs  Lab 06/07/19 1427 06/09/19 0454 06/12/19 0444  NA 138 136 134*  K 3.2* 3.4* 3.9  CL 101 105 109  CO2 27 23 21*  GLUCOSE 95 107* 89  BUN 12 15 11   CREATININE 1.05* 1.01* 0.94  CALCIUM 9.0 8.6* 8.4*  MG 2.1  --  1.9   GFR: Estimated Creatinine Clearance: 91.8 mL/min (by C-G formula based on SCr of 0.94 mg/dL).  Liver Function Tests: No results for input(s): AST, ALT, ALKPHOS, BILITOT, PROT, ALBUMIN in the last 168 hours. No results for input(s): LIPASE, AMYLASE in the last 168 hours. No results for input(s): AMMONIA in the last 168 hours.   Recent Results (from the past 240 hour(s))  SARS CORONAVIRUS 2 (TAT 6-24 HRS) Nasopharyngeal Nasopharyngeal Swab     Status: None   Collection Time: 06/07/19  8:35 PM   Specimen: Nasopharyngeal Swab  Result Value Ref Range Status   SARS Coronavirus 2 NEGATIVE NEGATIVE Final    Comment: (NOTE) SARS-CoV-2 target nucleic acids are NOT DETECTED. The SARS-CoV-2 RNA is generally detectable in upper and lower respiratory specimens during the acute phase of infection. Negative results do not preclude SARS-CoV-2 infection,  do not rule out co-infections with other pathogens, and should not be used as the sole basis for treatment or other patient management decisions. Negative results must be combined with clinical observations, patient history, and epidemiological information. The expected result is Negative. Fact Sheet for Patients: HairSlick.no Fact Sheet for Healthcare  Providers: quierodirigir.com This test is not yet approved or cleared by the [place] FDA and  has been authorized for detection and/or diagnosis of SARS-CoV-2 by FDA under an Emergency Use Authorization (EUA). This EUA will remain  in effect (meaning this test can be used) for the duration of the COVID-19 declaration under Section 56 4(b)(1) of the Act, 21 U.S.C. section 360bbb-3(b)(1), unless the authorization is terminated or revoked sooner. Performed at [facility], [address], [place], [place] [address]      Scheduled Meds: . docusate sodium  100 mg Oral BID  . enoxaparin (LOVENOX) injection  40 mg Subcutaneous Q24H  . pantoprazole  40 mg Oral QAC breakfast  . topiramate  100 mg Oral BID   Continuous Infusions: . Immune Globulin 10% Stopped (06/12/19 0000)     LOS: 4 days   Lonia Blood, MD [facility] Office  [phone] Pager - Text Page per Loretha Stapler  If 7PM-7AM, please contact night-coverage per Amion 06/12/2019, 9:32 AM

## 2019-06-12 NOTE — Progress Notes (Signed)
Upon pt request, pt bed alarm off; pt up ad lib in room independently. Dionne Bucy RN

## 2019-06-12 NOTE — Progress Notes (Signed)
IVIG Infusion started at this time @ 2135.

## 2019-06-13 MED ORDER — ACETAMINOPHEN 325 MG PO TABS: 650.0000 mg | ORAL_TABLET | Freq: Four times a day (QID) | ORAL | Status: AC | PRN

## 2019-06-13 NOTE — Progress Notes (Signed)
Neurology Progress Note   S:// Seen and examined.  Much improved sensation.  No pain in legs.   O:// Current vital signs: BP 119/70 (BP Location: Left Arm)   Pulse 71   Temp 98.6 F (37 C) (Oral)   Resp 18   Ht 5\' 4"  (1.626 m)   Wt 112.5 kg   SpO2 95%   BMI 42.57 kg/m  Vital signs in last 24 hours: Temp:  [98 F (36.7 C)-99 F (37.2 C)] 98.6 F (37 C) (04/24 1319) Pulse Rate:  [65-86] 71 (04/24 1319) Resp:  [16-19] 18 (04/24 1319) BP: (99-121)/(57-77) 119/70 (04/24 1319) SpO2:  [95 %-100 %] 95 % (04/24 1319) Neurological exam Awake alert oriented x3 No aphasia No dysarthria CN II to XII intact Motor exam: 5/5 all over Sensation: Intact to light touch all over Coordination: Intact finger-nose-finger testing Gait normal DTRs: With the absence of being able to elicit ankle jerks, all the other reflexes are 2+.   Medications  Current Facility-Administered Medications:  .  acetaminophen (TYLENOL) tablet 650 mg, 650 mg, Oral, Q6H PRN, 650 mg at 06/12/19 1718 **OR** [DISCONTINUED] acetaminophen (TYLENOL) suppository 650 mg, 650 mg, Rectal, Q6H PRN, Karmen Bongo, MD .  bisacodyl (DULCOLAX) EC tablet 5 mg, 5 mg, Oral, Daily PRN, Karmen Bongo, MD .  docusate sodium (COLACE) capsule 100 mg, 100 mg, Oral, BID, Karmen Bongo, MD, 100 mg at 06/09/19 1047 .  enoxaparin (LOVENOX) injection 40 mg, 40 mg, Subcutaneous, Q24H, Karmen Bongo, MD, 40 mg at 06/12/19 2104 .  hydrALAZINE (APRESOLINE) injection 5 mg, 5 mg, Intravenous, Q4H PRN, Karmen Bongo, MD .  HYDROcodone-acetaminophen (NORCO/VICODIN) 5-325 MG per tablet 1-2 tablet, 1-2 tablet, Oral, Q4H PRN, Karmen Bongo, MD, 2 tablet at 06/12/19 2104 .  morphine 2 MG/ML injection 2 mg, 2 mg, Intravenous, Q2H PRN, Karmen Bongo, MD .  ondansetron [facility]) tablet 4 mg, 4 mg, Oral, Q6H PRN **OR** ondansetron (ZOFRAN) injection 4 mg, 4 mg, Intravenous, Q6H PRN, Karmen Bongo, MD, 4 mg at 06/12/19 2104 .  pantoprazole  (PROTONIX) EC tablet 40 mg, 40 mg, Oral, QAC breakfast, Amin, Ankit Chirag, MD, 40 mg at 06/13/19 0636 .  polyethylene glycol (MIRALAX / GLYCOLAX) packet 17 g, 17 g, Oral, Daily PRN, Karmen Bongo, MD .  topiramate (TOPAMAX) tablet 100 mg, 100 mg, Oral, BID, Karmen Bongo, MD, 100 mg at 06/13/19 5366 .  zolpidem (AMBIEN) tablet 5 mg, 5 mg, Oral, QHS PRN, Karmen Bongo, MD Labs CBC    Component Value Date/Time   WBC 5.3 06/09/2019 0454   RBC 5.33 (H) 06/09/2019 0454   HGB 12.7 06/09/2019 0454   HCT 42.0 06/09/2019 0454   PLT 199 06/09/2019 0454   MCV 78.8 (L) 06/09/2019 0454   MCH 23.8 (L) 06/09/2019 0454   MCHC 30.2 06/09/2019 0454   RDW 14.0 06/09/2019 0454   LYMPHSABS 2.9 11/23/2009 1500   MONOABS 0.4 11/23/2009 1500   EOSABS 0.1 11/23/2009 1500   BASOSABS 0.1 11/23/2009 1500    CMP     Component Value Date/Time   NA 134 (L) 06/12/2019 0444   K 3.9 06/12/2019 0444   CL 109 06/12/2019 0444   CO2 21 (L) 06/12/2019 0444   GLUCOSE 89 06/12/2019 0444   BUN 11 06/12/2019 0444   CREATININE 0.94 06/12/2019 0444   CALCIUM 8.4 (L) 06/12/2019 0444   GFRNONAA >60 06/12/2019 0444   GFRAA >60 06/12/2019 0444   Assessment:  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 COVID-19 chart received a few days prior  to symptom onset-nothing literature suggest there is any association but it is a relatively new vaccination and we do not have enough follow-up data.  Recommendations: Completed 5 days of IVIG Follow-up outpatient neurology in 2 to 4 weeks-[facility]. I would defer the second dose of Covid shot for at least 6 weeks. I discussed this with on-call ID provider as a curbside consultation who agreed with the vaccine recs. D/W Dr. Sharon Seller Answered patient's questions.  -- Milon Dikes, MD [facility] Pager: [phone] If 7pm to 7am, please call on call as listed on [facility].

## 2019-06-13 NOTE — Progress Notes (Signed)
Patient being discharged home with self care. Education and information provided to patient. IV removed. All belongings with patient. Pt leaving unit via wheelchair.

## 2019-06-13 NOTE — Discharge Instructions (Signed)
Guillain-Barr Syndrome Guillain-Barr syndrome (GBS) is a rare disorder in which the body's disease-fighting system (immune system) attacks the nerves. This causes numbness and tingling. It can eventually develop into a serious condition and, in some cases, cause paralysis. GBS does not spread from person to person (is not contagious). Most people with GBS recover within a few months. However, some people may have muscle weakness that lasts for a few years. What are the causes? The exact cause of GBS is not known. In most cases, GBS develops a few days or weeks after you have an infection, such as:  A stomach infection caused by Campylobacter bacteria. This type of infection usually causes diarrhea.  An infection of the nose, throat, and upper airways (respiratory infection), such as a cold or the flu.  A viral infection. Less commonly, GBS may be triggered by:  Surgery.  A vaccine. What are the signs or symptoms? The most common first symptoms are tingling, numbness, and weakness in the lower legs. Other symptoms may develop over hours, days, or weeks. These may include:  Muscle weakness that spreads from the legs to the abdomen and arms.  Aching or burning pain.  Weakness of muscles in the face.  Trouble chewing or swallowing.  Slurred speech.  Inability to move the arms, legs, or both (paralysis).  Difficulty breathing. How is this diagnosed? This condition may be diagnosed based on:  Your symptoms and medical history. Your health care provider will ask about any recent infections you have had.  A physical exam.  A test that measures how quickly your nerves carry signals to your muscles (nerve conduction velocity test).  Testing a sample of fluid that surrounds the spinal cord (lumbar puncture). How is this treated? If your symptoms are mild, you may be given medicines to control your symptoms. If your condition becomes severe, you may need to be treated at a hospital.  At the hospital, treatment may include:  Medicines. These help to improve numbness or tingling sensations in your arms and legs (paresthesias) caused by irritation of the nerves.  Plasma exchange (plasmapheresis). This is a procedure in which the immune system cells that are attacking your nerves are removed from your blood.  Getting proteins (immunoglobulins or antibodies) that slow down the immune system's attack on your nerves. These may be given through an IV inserted into one of your veins.  Oxygen and breathing assistance. Treatment may also include physical therapy to help strengthen your muscles. After being treated in the hospital, you may be transferred to a rehabilitation center to get intensive physical therapy. Once your strength improves, you may continue to get physical therapy at home. Additional physical therapy may be needed for many months. Follow these instructions at home:   If physical therapy was prescribed, do exercises as told by your health care provider.  Make sure you have the support you need. Someone may need to help you bathe, dress, and prepare meals until you regain your strength.  Rest as needed. Return to your normal activities as told by your health care provider. Ask your health care provider what activities are safe for you.  Take over-the-counter and prescription medicines only as told by your health care provider.  Keep all follow-up visits as told by your health care provider. This is important. Contact a health care provider if you:  Have new symptoms or your symptoms get worse.  Do not feel like you are getting better or regaining strength.  Do not feel safe   or supported at home.  Are having trouble coping with your recovery. Get help right away if you:  Have trouble breathing.  Have trouble swallowing.  Develop a fever.  Choke after eating or drinking.  Cannot move.  Faint.  Have pain, swelling, warmth, or redness in an arm or  leg.  Have chest pain. Summary  Guillain-Barr syndrome (GBS) is a rare disorder in which the body's disease-fighting system (immune system) attacks the nerves.  GBS often requires treatment at a hospital. Treatment in the hospital may include medicines, plasmapheresis, getting proteins that slow down the immune system's attack on your nerves, or oxygen and breathing assistance.  Months of physical therapy may be needed until you regain your strength. This information is not intended to replace advice given to you by your health care provider. Make sure you discuss any questions you have with your health care provider. Document Revised: 09/05/2018 Document Reviewed: 02/10/2017 Elsevier Patient Education  2020 [employer]  

## 2019-06-13 NOTE — Discharge Summary (Signed)
DISCHARGE SUMMARY  Kaitlyn Ashley  MR#: [id number]  DOB:09/10/1970  Date of Admission: 06/07/2019 Date of Discharge: 06/13/2019  Attending Physician:Gracynn Rajewski Hennie Duos, MD  Patient's GLO:[id number]-PPIRJ, Iona Beard, MD  Consults: Neurology   Disposition: d/c home   Follow-up Appts: Follow-up Information    Osei-Bonsu, Iona Beard, MD Follow up in 1 week(s).   Specialty: Internal Medicine Contact information: [address] [place] [address] [phone]           Discharge Diagnoses: Bilateral lower extremity numbness and tingling - suspected Guillain-Barr syndrome HTN Mild hypokalemia Thalassemia History of migraines Obesity - Body mass index is 42.57 kg/m.  Initial presentation: 49yo with a history of HTN and thalassemia who presented 4/19 with the acute onset of bilateral lower extremity weakness and numbness.  She received her first dose of the Covid vaccine 4/9.  Her symptoms began 4/12.  At the time of initial evaluation her CT head was negative.  An MRI brain was also unremarkable.  Hospital Course:  Bilateral lower extremity numbness and tingling - suspected Guillain-Barr syndrome Neurology followrf and directrf care -CT head and MRI brain unrevealing - LP performed 4/19 noted albumin with no cytologic dissociation - working diagnosis was GBS - was treated with 5-day course of IVIG at direction of neurology w/ prompt improvement in her sx -tolerated her IVIG treatments without difficulty - was able to ambulate independently at time of d/c w/ no PT needs    HTN Despite reported history of HTN the patient's blood pressure was well controlled during this hospital stay without need for medical therapy  Mild hypokalemia Corrected with supplementation -magnesium is normal -likely related to poor oral intake and unfamiliar setting  Thalassemia Stable during this hospitalization with no complications  History of migraines Uses Topamax as needed -no  recurrence during this hospital stay  Obesity - Body mass index is 42.57 kg/m.  Allergies as of 06/13/2019      Reactions   Latex Itching   Penicillins    Sulfa Antibiotics       Medication List    TAKE these medications   acetaminophen 325 MG tablet Commonly known as: TYLENOL Take 2 tablets (650 mg total) by mouth every 6 (six) hours as needed for mild pain (or Fever >/= 101).   furosemide 20 MG tablet Commonly known as: LASIX Take 20 mg by mouth daily.   phentermine 37.5 MG tablet Commonly known as: ADIPEX-P Take 37.5 mg by mouth daily.   topiramate 100 MG tablet Commonly known as: TOPAMAX Take 100 mg by mouth 2 (two) times daily.       Day of Discharge BP 119/70 (BP Location: Left Arm)   Pulse 71   Temp 98.6 F (37 C) (Oral)   Resp 18   Ht 5\' 4"  (1.626 m)   Wt 112.5 kg   SpO2 95%   BMI 42.57 kg/m   Physical Exam: General: No acute respiratory distress Lungs: Clear to auscultation bilaterally without wheezes or crackles Cardiovascular: Regular rate and rhythm without murmur gallop or rub normal S1 and S2 Abdomen: Nontender, nondistended, soft, bowel sounds positive, no rebound, no ascites, no appreciable mass Extremities: No significant cyanosis, clubbing, or edema bilateral lower extremities  Basic Metabolic Panel: Recent Labs  Lab 06/07/19 1427 06/09/19 0454 06/12/19 0444  NA 138 136 134*  K 3.2* 3.4* 3.9  CL 101 105 109  CO2 27 23 21*  GLUCOSE 95 107* 89  BUN 12 15 11   CREATININE 1.05* 1.01* 0.94  CALCIUM 9.0 8.6* 8.4*  MG 2.1  --  1.9   CBC: Recent Labs  Lab 06/07/19 1427 06/09/19 0454  WBC 5.7 5.3  HGB 14.2 12.7  HCT 45.9 42.0  MCV 79.0* 78.8*  PLT 230 199    Recent Results (from the past 240 hour(s))  SARS CORONAVIRUS 2 (TAT 6-24 HRS) Nasopharyngeal Nasopharyngeal Swab     Status: None   Collection Time: 06/07/19  8:35 PM   Specimen: Nasopharyngeal Swab  Result Value Ref Range Status   SARS Coronavirus 2 NEGATIVE NEGATIVE  Final    Comment: (NOTE) SARS-CoV-2 target nucleic acids are NOT DETECTED. The SARS-CoV-2 RNA is generally detectable in upper and lower respiratory specimens during the acute phase of infection. Negative results do not preclude SARS-CoV-2 infection, do not rule out co-infections with other pathogens, and should not be used as the sole basis for treatment or other patient management decisions. Negative results must be combined with clinical observations, patient history, and epidemiological information. The expected result is Negative. Fact Sheet for Patients: HairSlick.no Fact Sheet for Healthcare Providers: quierodirigir.com This test is not yet approved or cleared by the [place] FDA and  has been authorized for detection and/or diagnosis of SARS-CoV-2 by FDA under an Emergency Use Authorization (EUA). This EUA will remain  in effect (meaning this test can be used) for the duration of the COVID-19 declaration under Section 56 4(b)(1) of the Act, 21 U.S.C. section 360bbb-3(b)(1), unless the authorization is terminated or revoked sooner. Performed at [facility], [address], [place], [place] [address]      Time spent in discharge (includes decision making & examination of pt): 30 minutes  06/13/2019, 4:12 PM   Lonia Blood, MD [facility] Office  [phone]

## 2019-06-14 LAB — MISC LABCORP TEST (SEND OUT): Labcorp test code: 830721

## 2019-06-15 DIAGNOSIS — Z862 Personal history of diseases of the blood and blood-forming organs and certain disorders involving the immune mechanism: Secondary | ICD-10-CM | POA: Diagnosis not present

## 2019-06-15 DIAGNOSIS — I10 Essential (primary) hypertension: Secondary | ICD-10-CM | POA: Diagnosis not present

## 2019-06-15 DIAGNOSIS — G43909 Migraine, unspecified, not intractable, without status migrainosus: Secondary | ICD-10-CM | POA: Diagnosis not present

## 2019-06-15 DIAGNOSIS — F419 Anxiety disorder, unspecified: Secondary | ICD-10-CM | POA: Diagnosis not present

## 2019-06-15 DIAGNOSIS — Z131 Encounter for screening for diabetes mellitus: Secondary | ICD-10-CM | POA: Diagnosis not present

## 2019-06-15 DIAGNOSIS — Z0001 Encounter for general adult medical examination with abnormal findings: Secondary | ICD-10-CM | POA: Diagnosis not present

## 2019-06-15 DIAGNOSIS — Z136 Encounter for screening for cardiovascular disorders: Secondary | ICD-10-CM | POA: Diagnosis not present

## 2019-06-15 DIAGNOSIS — Z1329 Encounter for screening for other suspected endocrine disorder: Secondary | ICD-10-CM | POA: Diagnosis not present

## 2019-07-13 DIAGNOSIS — R5383 Other fatigue: Secondary | ICD-10-CM | POA: Diagnosis not present

## 2019-07-13 DIAGNOSIS — R635 Abnormal weight gain: Secondary | ICD-10-CM | POA: Diagnosis not present

## 2019-07-27 ENCOUNTER — Ambulatory Visit: Payer: BC Managed Care – PPO | Admitting: Physical Therapy

## 2019-07-30 DIAGNOSIS — I10 Essential (primary) hypertension: Secondary | ICD-10-CM | POA: Diagnosis not present

## 2019-07-30 DIAGNOSIS — R600 Localized edema: Secondary | ICD-10-CM | POA: Diagnosis not present

## 2019-07-30 DIAGNOSIS — G43909 Migraine, unspecified, not intractable, without status migrainosus: Secondary | ICD-10-CM | POA: Diagnosis not present

## 2019-07-30 DIAGNOSIS — F419 Anxiety disorder, unspecified: Secondary | ICD-10-CM | POA: Diagnosis not present

## 2019-08-04 ENCOUNTER — Encounter: Payer: Self-pay | Admitting: Physical Therapy

## 2019-08-04 ENCOUNTER — Other Ambulatory Visit: Payer: Self-pay

## 2019-08-04 ENCOUNTER — Ambulatory Visit: Payer: BC Managed Care – PPO | Attending: Internal Medicine | Admitting: Physical Therapy

## 2019-08-04 VITALS — BP 100/62 | HR 85

## 2019-08-04 DIAGNOSIS — R262 Difficulty in walking, not elsewhere classified: Secondary | ICD-10-CM | POA: Insufficient documentation

## 2019-08-04 DIAGNOSIS — R2681 Unsteadiness on feet: Secondary | ICD-10-CM | POA: Diagnosis not present

## 2019-08-04 DIAGNOSIS — M6281 Muscle weakness (generalized): Secondary | ICD-10-CM | POA: Diagnosis not present

## 2019-08-04 NOTE — Therapy (Signed)
[facility] [address]  [address] [place], [place], [address] Phone: [phone]   Fax:  [phone]  Physical Therapy Evaluation  Patient Details  Name: Kaitlyn Ashley MRN: [id number] Date of Birth: 03/06/1970 Referring Provider (PT): Jackie Plum, MD   Encounter Date: 08/04/2019   PT End of Session - 08/04/19 1719    Visit Number 1    Number of Visits 13    Date for PT Re-Evaluation 09/15/19    Authorization Type BCBS    PT Start Time 1615    PT Stop Time 1709    PT Time Calculation (min) 54 min    Activity Tolerance Patient tolerated treatment well;Patient limited by fatigue    Behavior During Therapy [facility] for tasks assessed/performed           Past Medical History:  Diagnosis Date  . Anemia   . Hypertension   . Migraines   . Morbid obesity with BMI of 40.0-44.9, adult (HCC)   . Thalassemia     Past Surgical History:  Procedure Laterality Date  . ABDOMINAL HYSTERECTOMY    . BREAST REDUCTION SURGERY    . CESAREAN SECTION      Vitals:   08/04/19 1635  BP: 100/62  Pulse: 85  SpO2: 98%      Subjective Assessment - 08/04/19 1617    Subjective Patient reports that she had her 1st dose of the COVID vaccine on 05/29/19. Started to have N/T in her B toes up to the knees as well as from B fingers to the forearms 3 days later. On the 19th she experienced LE weakness to the point of being unable to move. Was admitted at [facility] from 04/19-04/24 and notes that a spinal tap confirmed GBS. Had some acute care PT while at the hospital. Received IVIG transfusion which improved her symptoms. Remaining symptoms include difficulty walking, particularly having in heels/wedges, fatigue, aching in arms and legs. Went to see her MD this past Thursday d/t swelling in feet- was taken off Lasix and put on another antihypertensive but not sure of the pain. Leg pain is relieved with ice. Had a fall about 1.5 weeks ago when she  stood up from sitting.    Pertinent History thalassemia, migraines, HTN, anemia    Limitations Lifting;Standing;Walking;House hold activities    How long can you walk comfortably? 10 min    Diagnostic tests CT head and MRI brain were normal during hospitalization from 04/19-04/24    Patient Stated Goals "get back to being able to walk right"    Currently in Pain? Yes    Pain Score 4     Pain Location Leg    Pain Orientation Right;Left    Pain Descriptors / Indicators Aching    Pain Type Acute pain    Pain Radiating Towards from toes to knees    Multiple Pain Sites Yes    Pain Score 3    Pain Location Arm    Pain Orientation Right;Left    Pain Descriptors / Indicators Aching    Pain Type Acute pain    Pain Radiating Towards from hands to forearms              [facility] PT Assessment - 08/04/19 1630      Assessment   Medical Diagnosis Weakness of UEs    Referring Provider (PT) Jackie Plum, MD    Onset Date/Surgical Date 06/01/19    Hand Dominance Right    Next  MD Visit not scheduled    Prior Therapy no      Precautions   Precautions None      Balance Screen   Has the patient fallen in the past 6 months Yes    How many times? 1    Has the patient had a decrease in activity level because of a fear of falling?  No    Is the patient reluctant to leave their home because of a fear of falling?  No      Home Nurse, mental health Private residence    Living Arrangements Spouse/significant other;Children    Available Help at Discharge Family    Type of Home House   townhome   Home Access Stairs to enter    Entrance Stairs-Number of Steps 1    Entrance Stairs-Rails None    Home Layout Two level    Alternate Level Stairs-Number of Steps 9 with a landing in between    Home Equipment None      Prior Function   Level of Independence Independent    Vocation Full time employment    Vocation Requirements working from home    Leisure attending sporting event       Cognition   Overall Cognitive Status Within Functional Limits for tasks assessed      Sensation   Light Touch Appears Intact      Coordination   Gross Motor Movements are Fluid and Coordinated Yes      Posture/Postural Control   Posture/Postural Control Postural limitations    Postural Limitations Rounded Shoulders      ROM / Strength   AROM / PROM / Strength Strength;AROM      AROM   AROM Assessment Site Ankle    Right/Left Ankle Right;Left    Right Ankle Dorsiflexion -2    Left Ankle Dorsiflexion 10      Strength   Strength Assessment Site Hip;Knee;Ankle;Shoulder;Elbow;Wrist;Hand    Right/Left Shoulder Right;Left    Right Shoulder Flexion 4+/5    Right Shoulder ABduction 4+/5    Right Shoulder Internal Rotation 4+/5    Right Shoulder External Rotation 4+/5    Left Shoulder Flexion 4+/5    Left Shoulder ABduction 4-/5    Left Shoulder Internal Rotation 4+/5    Left Shoulder External Rotation 4+/5    Right/Left Elbow Right;Left    Right Elbow Flexion 4+/5    Right Elbow Extension 4+/5    Left Elbow Flexion 4+/5    Left Elbow Extension 4/5    Right/Left Wrist Right;Left    Right Wrist Flexion 4/5    Right Wrist Extension 4/5    Left Wrist Flexion 4/5    Left Wrist Extension 4/5    Right/Left hand Right;Left    Right Hand Grip (lbs) 25   40, 20, 15   Left Hand Grip (lbs) 26.33   30, 30, 19   Right/Left Hip Right;Left    Right Hip Flexion 4+/5    Right Hip ABduction 4+/5    Right Hip ADduction 4+/5    Left Hip Flexion 4/5    Left Hip ABduction 4/5    Left Hip ADduction 4/5    Right/Left Knee Right;Left    Right Knee Flexion 4+/5    Right Knee Extension 4+/5    Left Knee Flexion 4/5    Left Knee Extension 4/5    Right/Left Ankle Right;Left    Right Ankle Dorsiflexion 4+/5    Right Ankle Plantar Flexion 4+/5  Left Ankle Dorsiflexion 4+/5    Left Ankle Plantar Flexion 4-/5      Ambulation/Gait   Gait Pattern Step-through pattern;Lateral hip instability     Ambulation Surface Level;Indoor    Gait velocity [facility]      Standardized Balance Assessment   Standardized Balance Assessment Five Times Sit to Stand    Five times sit to stand comments  14.97 sec without UE support                      Objective measurements completed on examination: See above findings.               PT Education - 08/04/19 1719    Education Details prognosis, POC, HEP    Person(s) Educated Patient    Methods Explanation;Demonstration;Tactile cues;Verbal cues;Handout    Comprehension Verbalized understanding;Returned demonstration            PT Short Term Goals - 08/04/19 1725      PT SHORT TERM GOAL #1   Title Patient to be independent with initial HEP.    Time 3    Period Weeks    Status New    Target Date 08/25/19             PT Long Term Goals - 08/04/19 1725      PT LONG TERM GOAL #1   Title Patient to be independent with advanced HEP.    Time 6    Period Weeks    Status New    Target Date 09/15/19      PT LONG TERM GOAL #2   Title Patient to score >22/30 on FGA in order to decrease risk of falls.    Time 6    Period Weeks    Status New    Target Date 09/15/19      PT LONG TERM GOAL #3   Title Patient to demonstrate B UE strength >/=4+/5 and grip strength >40 lbs.    Time 6    Period Weeks    Status New    Target Date 09/15/19      PT LONG TERM GOAL #4   Title Patient to demonstrate B LE strength >/=4+/5.    Time 6    Period Weeks    Status New    Target Date 09/15/19      PT LONG TERM GOAL #5   Title Patient to score <12 sec on 5xSTS without UEs in order to decrease risk of falls.    Time 6    Period Weeks    Status New    Target Date 09/15/19      Additional Long Term Goals   Additional Long Term Goals Yes      PT LONG TERM GOAL #6   Title Patient to report tolerance of 60 minutes of walking/standing without fatigue limiting.    Time 6    Period Weeks    Status New    Target Date 09/15/19                   Plan - 08/04/19 1719    Clinical Impression Statement Patient is a 48y/o F presenting to OPPT with c/o B UE and LE weakness after being hospitalized 06/08/19-06/13/19 for acute onset of N/T and weakness. Patient diagnosed with GBS and treated with IVIG with good improvement in symptoms. Remaining symptoms include decreased activity tolerance d/t fatigue, imbalance, aching and UE/LEs, and weakness in UEs/LEs. Patient today presenting with  pronounced weakness in distal B UEs and LEs, decreased B ankle dorsiflexion AROM, and difficulty with functional activities. Patient quick to fatigue with 5xSTS and demonstrated an increased risk of falls according to her score on this test. Patient was educated on UE and LE strengthening HEP and reported understanding. Would benefit from skilled PT services 2x/week for 6 weeks to address aforementioned impairments.    Personal Factors and Comorbidities Age;Comorbidity 3+;Fitness;Past/Current Experience;Profession;Time since onset of injury/illness/exacerbation    Comorbidities thalassemia, migraines, HTN, anemia    Examination-Activity Limitations Bend;Squat;Stairs;Caring for Others;Carry;Stand;Transfers;Hygiene/Grooming;Lift;Locomotion Level;Reach Overhead    Examination-Participation Restrictions Church;Cleaning;Shop;Community Activity;Driving;Laundry;Meal Prep    Stability/Clinical Decision Making Evolving/Moderate complexity    Clinical Decision Making Moderate    Rehab Potential Good    PT Frequency 2x / week    PT Duration 6 weeks    PT Treatment/Interventions ADLs/Self Care Home Management;Cryotherapy;Electrical Stimulation;Moist Heat;Balance training;Therapeutic exercise;Therapeutic activities;Functional mobility training;Stair training;Gait training;Ultrasound;Neuromuscular re-education;Patient/family education;Manual techniques;Vasopneumatic Device;Taping;Energy conservation;Dry needling;Passive range of motion    PT Next Visit  Plan reassess HEP; assess FGA; progress distal to proximal LE and UE strength           Patient will benefit from skilled therapeutic intervention in order to improve the following deficits and impairments:  Decreased endurance, Cardiopulmonary status limiting activity, Decreased activity tolerance, Decreased strength, Impaired UE functional use, Pain, Decreased balance, Difficulty walking, Improper body mechanics, Postural dysfunction, Impaired flexibility  Visit Diagnosis: Muscle weakness (generalized)  Difficulty in walking, not elsewhere classified  Unsteadiness on feet     Problem List Patient Active Problem List   Diagnosis Date Noted  . Thalassemia   . Hypertension   . Morbid obesity with BMI of 40.0-44.9, adult [facility])      Janene Harvey, PT, DPT 08/04/19 5:32 PM    [facility] [address]  [address] [place], [place], [address] Phone: [phone]   Fax:  [phone]  Name: Kaitlyn Ashley MRN: [id number] Date of Birth: 04-Apr-1970

## 2019-08-10 ENCOUNTER — Other Ambulatory Visit: Payer: Self-pay

## 2019-08-10 ENCOUNTER — Ambulatory Visit: Payer: BC Managed Care – PPO | Admitting: Physical Therapy

## 2019-08-10 ENCOUNTER — Encounter: Payer: Self-pay | Admitting: Physical Therapy

## 2019-08-10 DIAGNOSIS — R262 Difficulty in walking, not elsewhere classified: Secondary | ICD-10-CM | POA: Diagnosis not present

## 2019-08-10 DIAGNOSIS — M6281 Muscle weakness (generalized): Secondary | ICD-10-CM

## 2019-08-10 DIAGNOSIS — R2681 Unsteadiness on feet: Secondary | ICD-10-CM

## 2019-08-10 NOTE — Therapy (Signed)
[facility] [address]  [address] [place], [place], [address] Phone: [phone]   Fax:  [phone]  Physical Therapy Treatment  Patient Details  Name: Kaitlyn Ashley MRN: [id number] Date of Birth: 14-Jun-1970 Referring Provider (PT): Jackie Plum, MD   Encounter Date: 08/10/2019   PT End of Session - 08/10/19 1745    Visit Number 2    Number of Visits 13    Date for PT Re-Evaluation 09/15/19    Authorization Type BCBS    PT Start Time 1652    PT Stop Time 1742    PT Time Calculation (min) 50 min    Activity Tolerance Patient tolerated treatment well;Patient limited by fatigue    Behavior During Therapy [facility] for tasks assessed/performed           Past Medical History:  Diagnosis Date  . Anemia   . Hypertension   . Migraines   . Morbid obesity with BMI of 40.0-44.9, adult (HCC)   . Thalassemia     Past Surgical History:  Procedure Laterality Date  . ABDOMINAL HYSTERECTOMY    . BREAST REDUCTION SURGERY    . CESAREAN SECTION      There were no vitals filed for this visit.   Subjective Assessment - 08/10/19 1654    Subjective Was unable to celebrate her birthday d/t being extremely fatigued. On Thursday, B LEs went numb but without any motor effects. THis continued from Thursday to the weekend. Denies any return of this numbness since Thursdya, but does report a throbbing sensation.    Pertinent History thalassemia, migraines, HTN, anemia    Diagnostic tests CT head and MRI brain were normal during hospitalization from 04/19-04/24    Patient Stated Goals "get back to being able to walk right"    Currently in Pain? Yes    Pain Score 4     Pain Location Leg    Pain Orientation Left    Pain Descriptors / Indicators Throbbing    Pain Type Acute pain    Pain Radiating Towards from toes to knees                             [facility] Adult PT Treatment/Exercise - 08/10/19 0001      Exercises    Exercises Wrist;Knee/Hip;Ankle      Knee/Hip Exercises: Seated   Sit to Sand without UE support   2nd red TB above knees, 3rd ball squeeze, 4th 10# at chest     Wrist Exercises   Wrist Flexion Strengthening;Left;15 reps    Bar Weights/Barbell (Wrist Flexion) 2 lbs    Wrist Extension Strengthening;Left;15 reps    Bar Weights/Barbell (Wrist Extension) 2 lbs    Wrist Radial Deviation Strengthening;Left;10 reps    Wrist Radial Deviation Limitations holding onto end of hammer    Wrist Ulnar Deviation Strengthening;Left;10 reps    Wrist Ulnar Deviation Limitations holding onto end of hammer      Ankle Exercises: Stretches   Gastroc Stretch 1 rep;30 seconds    Gastroc Stretch Limitations L gastroc stretch at counter      Ankle Exercises: Seated   Other Seated Ankle Exercises R and L ankle dorsiflexion with yellow TB x10 each LE                  PT Education - 08/10/19 1744    Education Details edu on energy conservation with work tasks as  well as HEP performance; advised patient to contact her MD about concerns about fatigue getting in the way of being able to work; update to [employer]) Educated Patient    Methods Explanation;Demonstration;Tactile cues;Verbal cues;Handout    Comprehension Verbalized understanding;Returned demonstration            PT Short Term Goals - 08/10/19 1751      PT SHORT TERM GOAL #1   Title Patient to be independent with initial HEP.    Time 3    Period Weeks    Status On-going    Target Date 08/25/19             PT Long Term Goals - 08/10/19 1751      PT LONG TERM GOAL #1   Title Patient to be independent with advanced HEP.    Time 6    Period Weeks    Status On-going      PT LONG TERM GOAL #2   Title Patient to score >22/30 on FGA in order to decrease risk of falls.    Time 6    Period Weeks    Status On-going      PT LONG TERM GOAL #3   Title Patient to demonstrate B UE strength >/=4+/5 and grip strength >40 lbs.     Time 6    Period Weeks    Status On-going      PT LONG TERM GOAL #4   Title Patient to demonstrate B LE strength >/=4+/5.    Time 6    Period Weeks    Status On-going      PT LONG TERM GOAL #5   Title Patient to score <12 sec on 5xSTS without UEs in order to decrease risk of falls.    Time 6    Period Weeks    Status On-going      PT LONG TERM GOAL #6   Title Patient to report tolerance of 60 minutes of walking/standing without fatigue limiting.    Time 6    Period Weeks    Status On-going                 Plan - 08/10/19 1746    Clinical Impression Statement Patient reporting an episode of B LE numbness and fatigue starting on Thursday. Numbness resolved that day, but fatigue continued into the weekend. Patient reporting concern about being able to complete work tasks d/t fatigue, thus educated patient on energy conservation techniques and advised patient to contact her MD d/t concern over being able to work 40 hours. Patient was able to perform STS with added challenges with report of 4-5/10 fatigue initially, which decreased with each subsequent set. Worked on distal UE and LE strengthening ther-ex with intermittent cues to decrease speed in order to increase challenge. Allowed patient sitting rest breaks intermittently between exercises d/t fatigue. Updated HEP with exercises that were well-tolerated today. Patient reported understanding and without complaints at end of session.    Comorbidities thalassemia, migraines, HTN, anemia    PT Treatment/Interventions ADLs/Self Care Home Management;Cryotherapy;Electrical Stimulation;Moist Heat;Balance training;Therapeutic exercise;Therapeutic activities;Functional mobility training;Stair training;Gait training;Ultrasound;Neuromuscular re-education;Patient/family education;Manual techniques;Vasopneumatic Device;Taping;Energy conservation;Dry needling;Passive range of motion    PT Next Visit Plan assess FGA; progress distal to proximal  LE and UE strength    Consulted and Agree with Plan of Care Patient           Patient will benefit from skilled therapeutic intervention in order to improve the following deficits  and impairments:  Decreased endurance, Cardiopulmonary status limiting activity, Decreased activity tolerance, Decreased strength, Impaired UE functional use, Pain, Decreased balance, Difficulty walking, Improper body mechanics, Postural dysfunction, Impaired flexibility  Visit Diagnosis: Muscle weakness (generalized)  Difficulty in walking, not elsewhere classified  Unsteadiness on feet     Problem List Patient Active Problem List   Diagnosis Date Noted  . Thalassemia   . Hypertension   . Morbid obesity with BMI of 40.0-44.9, adult [facility])      Janene Harvey, PT, DPT 08/10/19 5:53 PM   [facility] [address]  [address] [place], [place], [address] Phone: [phone]   Fax:  [phone]  Name: Kaitlyn Ashley MRN: [id number] Date of Birth: 1970/06/30

## 2019-08-13 ENCOUNTER — Ambulatory Visit: Payer: BC Managed Care – PPO | Admitting: Physical Therapy

## 2019-08-17 ENCOUNTER — Other Ambulatory Visit: Payer: Self-pay

## 2019-08-17 ENCOUNTER — Ambulatory Visit: Payer: BC Managed Care – PPO | Admitting: Physical Therapy

## 2019-08-17 ENCOUNTER — Encounter: Payer: Self-pay | Admitting: Physical Therapy

## 2019-08-17 DIAGNOSIS — R262 Difficulty in walking, not elsewhere classified: Secondary | ICD-10-CM

## 2019-08-17 DIAGNOSIS — R2681 Unsteadiness on feet: Secondary | ICD-10-CM | POA: Diagnosis not present

## 2019-08-17 DIAGNOSIS — M6281 Muscle weakness (generalized): Secondary | ICD-10-CM | POA: Diagnosis not present

## 2019-08-17 NOTE — Therapy (Signed)
[facility]patient Rehabilitation [facility] [address]  [address] [place], [place], [address] Phone: [phone]   Fax:  [phone]  Physical Therapy Treatment  Patient Details  Name: Kaitlyn Ashley MRN: [id number] Date of Birth: Jun 16, 1970 Referring Provider (PT): Jackie Plum, MD   Encounter Date: 08/17/2019   PT End of Session - 08/17/19 1749    Visit Number 3    Number of Visits 13    Date for PT Re-Evaluation 09/15/19    Authorization Type BCBS    PT Start Time 1708    PT Stop Time 1747    PT Time Calculation (min) 39 min    Activity Tolerance Patient tolerated treatment well;Patient limited by fatigue    Behavior During Therapy [facility] for tasks assessed/performed           Past Medical History:  Diagnosis Date  . Anemia   . Hypertension   . Migraines   . Morbid obesity with BMI of 40.0-44.9, adult (HCC)   . Thalassemia     Past Surgical History:  Procedure Laterality Date  . ABDOMINAL HYSTERECTOMY    . BREAST REDUCTION SURGERY    . CESAREAN SECTION      There were no vitals filed for this visit.   Subjective Assessment - 08/17/19 1709    Subjective Feels about the same. Still feeling extremely tired. Had another episode of L LE numbness after driving to [place].    Pertinent History thalassemia, migraines, HTN, anemia    Diagnostic tests CT head and MRI brain were normal during hospitalization from 04/19-04/24    Patient Stated Goals "get back to being able to walk right"    Currently in Pain? No/denies              [facility] PT Assessment - 08/17/19 0001      Functional Gait  Assessment   Gait assessed  Yes    Gait Level Surface Walks 20 ft in less than 7 sec but greater than 5.5 sec, uses assistive device, slower speed, mild gait deviations, or deviates 6-10 in outside of the 12 in walkway width.    Change in Gait Speed Able to smoothly change walking speed without loss of balance or gait deviation. Deviate no more than 6 in  outside of the 12 in walkway width.    Gait with Horizontal Head Turns Performs head turns smoothly with slight change in gait velocity (eg, minor disruption to smooth gait path), deviates 6-10 in outside 12 in walkway width, or uses an assistive device.    Gait with Vertical Head Turns Performs task with slight change in gait velocity (eg, minor disruption to smooth gait path), deviates 6 - 10 in outside 12 in walkway width or uses assistive device    Gait and Pivot Turn Pivot turns safely within 3 sec and stops quickly with no loss of balance.    Step Over Obstacle Is able to step over 2 stacked shoe boxes taped together (9 in total height) without changing gait speed. No evidence of imbalance.    Gait with Narrow Base of Support Is able to ambulate for 10 steps heel to toe with no staggering.    Gait with Eyes Closed Walks 20 ft, no assistive devices, good speed, no evidence of imbalance, normal gait pattern, deviates no more than 6 in outside 12 in walkway width. Ambulates 20 ft in less than 7 sec.    Ambulating Backwards Walks 20 ft, no assistive devices, good  speed, no evidence for imbalance, normal gait    Steps Alternating feet, no rail.   slight collapse onto L LE when ascending   Total Score 27                         [facility] Adult PT Treatment/Exercise - 08/17/19 0001      Knee/Hip Exercises: Standing   Other Standing Knee Exercises sidestepping with red loop around ankles 2x62ft, 2x81ft with yellow loop around toes   cues to avoid lateral trunk lean     Ankle Exercises: Standing   Heel Raises Left;10 reps   10x B concentric, L eccentric, 5x L single heel raise                 PT Education - 08/17/19 1749    Education Details update to HEP    Person(s) Educated Patient    Methods Explanation;Demonstration;Tactile cues;Verbal cues;Handout    Comprehension Verbalized understanding;Returned demonstration            PT Short Term Goals - 08/17/19 1752       PT SHORT TERM GOAL #1   Title Patient to be independent with initial HEP.    Time 3    Period Weeks    Status Achieved    Target Date 08/25/19             PT Long Term Goals - 08/17/19 1752      PT LONG TERM GOAL #1   Title Patient to be independent with advanced HEP.    Time 6    Period Weeks    Status On-going      PT LONG TERM GOAL #2   Title Patient to score >22/30 on FGA in order to decrease risk of falls.    Time 6    Period Weeks    Status Achieved      PT LONG TERM GOAL #3   Title Patient to demonstrate B UE strength >/=4+/5 and grip strength >40 lbs.    Time 6    Period Weeks    Status On-going      PT LONG TERM GOAL #4   Title Patient to demonstrate B LE strength >/=4+/5.    Time 6    Period Weeks    Status On-going      PT LONG TERM GOAL #5   Title Patient to score <12 sec on 5xSTS without UEs in order to decrease risk of falls.    Time 6    Period Weeks    Status On-going      PT LONG TERM GOAL #6   Title Patient to report tolerance of 60 minutes of walking/standing without fatigue limiting.    Time 6    Period Weeks    Status On-going                 Plan - 08/17/19 1749    Clinical Impression Statement Patient reported another episode of L LE numbness which occurred last Saturday after a roadtrip to [place]. Not fully sure if she gets full return of sensation after these episodes, but has called to F/U with her PCP about these episodes and her ongoing fatigue. FGA was assessed today which demonstrated a decreased risk of falls. Patient did demonstrate some L LE weakness, particularly when ascending steps d/t tendency to slightly collapse onto L LE. Patient performed progressive hip and ankle ther-ex with minor cues for proper form. Reported understanding of  HEP update. Patient reported fatigue at end of session, but no other complaints.    Comorbidities thalassemia, migraines, HTN, anemia    PT Treatment/Interventions ADLs/Self Care Home  Management;Cryotherapy;Electrical Stimulation;Moist Heat;Balance training;Therapeutic exercise;Therapeutic activities;Functional mobility training;Stair training;Gait training;Ultrasound;Neuromuscular re-education;Patient/family education;Manual techniques;Vasopneumatic Device;Taping;Energy conservation;Dry needling;Passive range of motion    PT Next Visit Plan progress distal to proximal LE and UE strength    Consulted and Agree with Plan of Care Patient           Patient will benefit from skilled therapeutic intervention in order to improve the following deficits and impairments:  Decreased endurance, Cardiopulmonary status limiting activity, Decreased activity tolerance, Decreased strength, Impaired UE functional use, Pain, Decreased balance, Difficulty walking, Improper body mechanics, Postural dysfunction, Impaired flexibility  Visit Diagnosis: Muscle weakness (generalized)  Difficulty in walking, not elsewhere classified  Unsteadiness on feet     Problem List Patient Active Problem List   Diagnosis Date Noted  . Thalassemia   . Hypertension   . Morbid obesity with BMI of 40.0-44.9, adult [facility])      Janene Harvey, PT, DPT 08/17/19 5:54 PM   [facility] [address]  [address] [place], [place], [address] Phone: [phone]   Fax:  [phone]  Name: Kaitlyn Ashley MRN: [id number] Date of Birth: 1971/02/06

## 2019-08-20 ENCOUNTER — Encounter: Payer: Self-pay | Admitting: Physical Therapy

## 2019-08-20 ENCOUNTER — Other Ambulatory Visit: Payer: Self-pay

## 2019-08-20 ENCOUNTER — Ambulatory Visit: Payer: BC Managed Care – PPO | Attending: Internal Medicine | Admitting: Physical Therapy

## 2019-08-20 DIAGNOSIS — M6281 Muscle weakness (generalized): Secondary | ICD-10-CM

## 2019-08-20 DIAGNOSIS — R262 Difficulty in walking, not elsewhere classified: Secondary | ICD-10-CM | POA: Diagnosis not present

## 2019-08-20 DIAGNOSIS — R2681 Unsteadiness on feet: Secondary | ICD-10-CM | POA: Diagnosis not present

## 2019-08-20 NOTE — Therapy (Signed)
[facility] Outpatient Rehabilitation [facility] [address]  [address] [place], [place], [address] Phone: [phone]   Fax:  [phone]  Physical Therapy Treatment  Patient Details  Name: Kaitlyn Ashley MRN: [id number] Date of Birth: 1970-09-23 Referring Provider (PT): Jackie Plum, MD   Encounter Date: 08/20/2019   PT End of Session - 08/20/19 1310    Visit Number 4    Number of Visits 13    Date for PT Re-Evaluation 09/15/19    Authorization Type BCBS    PT Start Time 1310    PT Stop Time 1353    PT Time Calculation (min) 43 min    Activity Tolerance Patient tolerated treatment well;Patient limited by fatigue    Behavior During Therapy [facility] for tasks assessed/performed           Past Medical History:  Diagnosis Date  . Anemia   . Hypertension   . Migraines   . Morbid obesity with BMI of 40.0-44.9, adult (HCC)   . Thalassemia     Past Surgical History:  Procedure Laterality Date  . ABDOMINAL HYSTERECTOMY    . BREAST REDUCTION SURGERY    . CESAREAN SECTION      There were no vitals filed for this visit.   Subjective Assessment - 08/20/19 1311    Subjective Pt reports feels the same, still has fatigue - is getting used to it now    Currently in Pain? No/denies                             [facility] Adult PT Treatment/Exercise - 08/20/19 0001      Knee/Hip Exercises: Supine   Short Arc Quad Sets Strengthening;Both;15 reps      Wrist Exercises   Other wrist exercises bicep curls with 3#, top, bottom , full and wide, 10 reps each    Other wrist exercises digiflex 3# grip , digits with 1.5#       Ankle Exercises: Seated   BAPS Sitting;Level 3   20 reps, 12/6, 3/9 clock, CW/CCW cirlces   Other Seated Ankle Exercises 2x10 yellow band eversion, DF, PF and eversion, hand grip for inversion and PF.                     PT Short Term Goals - 08/17/19 1752      PT SHORT TERM GOAL #1   Title Patient to be  independent with initial HEP.    Time 3    Period Weeks    Status Achieved    Target Date 08/25/19             PT Long Term Goals - 08/17/19 1752      PT LONG TERM GOAL #1   Title Patient to be independent with advanced HEP.    Time 6    Period Weeks    Status On-going      PT LONG TERM GOAL #2   Title Patient to score >22/30 on FGA in order to decrease risk of falls.    Time 6    Period Weeks    Status Achieved      PT LONG TERM GOAL #3   Title Patient to demonstrate B UE strength >/=4+/5 and grip strength >40 lbs.    Time 6    Period Weeks    Status On-going      PT LONG TERM GOAL #4   Title  Patient to demonstrate B LE strength >/=4+/5.    Time 6    Period Weeks    Status On-going      PT LONG TERM GOAL #5   Title Patient to score <12 sec on 5xSTS without UEs in order to decrease risk of falls.    Time 6    Period Weeks    Status On-going      PT LONG TERM GOAL #6   Title Patient to report tolerance of 60 minutes of walking/standing without fatigue limiting.    Time 6    Period Weeks    Status On-going                 Plan - 08/20/19 1356    Clinical Impression Statement Kaitlyn Ashley had difficulty controlling  L3 on the BAPS board in a seated position, Lt more than Rt.  REquired VC to slow down and have control.  Her lower leg and had fatigued with exercise however she did well with alternating between upper/lower body work to allow for rests.    Rehab Potential Good    PT Frequency 2x / week    PT Duration 6 weeks    PT Treatment/Interventions ADLs/Self Care Home Management;Cryotherapy;Electrical Stimulation;Moist Heat;Balance training;Therapeutic exercise;Therapeutic activities;Functional mobility training;Stair training;Gait training;Ultrasound;Neuromuscular re-education;Patient/family education;Manual techniques;Vasopneumatic Device;Taping;Energy conservation;Dry needling;Passive range of motion    PT Next Visit Plan progress distal to proximal LE and  UE strength    Consulted and Agree with Plan of Care Patient           Patient will benefit from skilled therapeutic intervention in order to improve the following deficits and impairments:  Decreased endurance, Cardiopulmonary status limiting activity, Decreased activity tolerance, Decreased strength, Impaired UE functional use, Pain, Decreased balance, Difficulty walking, Improper body mechanics, Postural dysfunction, Impaired flexibility  Visit Diagnosis: Muscle weakness (generalized)  Difficulty in walking, not elsewhere classified  Unsteadiness on feet     Problem List Patient Active Problem List   Diagnosis Date Noted  . Thalassemia   . Hypertension   . Morbid obesity with BMI of 40.0-44.9, adult (HCC)     Roderic Scarce PT  08/20/2019, 1:57 PM  [facility] [address]  [address] [place], [place], [address] Phone: [phone]   Fax:  [phone]  Name: Kaitlyn Ashley MRN: [id number] Date of Birth: 24-Sep-1970

## 2019-08-25 ENCOUNTER — Encounter: Payer: Self-pay | Admitting: Physical Therapy

## 2019-08-25 ENCOUNTER — Other Ambulatory Visit: Payer: Self-pay

## 2019-08-25 ENCOUNTER — Ambulatory Visit: Payer: BC Managed Care – PPO | Admitting: Physical Therapy

## 2019-08-25 DIAGNOSIS — R262 Difficulty in walking, not elsewhere classified: Secondary | ICD-10-CM

## 2019-08-25 DIAGNOSIS — M6281 Muscle weakness (generalized): Secondary | ICD-10-CM | POA: Diagnosis not present

## 2019-08-25 DIAGNOSIS — R2681 Unsteadiness on feet: Secondary | ICD-10-CM

## 2019-08-25 NOTE — Therapy (Signed)
[facility] [address]  [place], [place], [address] Phone: [phone]   Fax:  [phone]  Physical Therapy Treatment  Patient Details  Name: Kaitlyn Ashley MRN: [id number] Date of Birth: Apr 03, 1970 Referring Provider (PT): Benito Mccreedy, MD   Encounter Date: 08/25/2019   PT End of Session - 08/25/19 1401    Visit Number 5    Number of Visits 13    Date for PT Re-Evaluation 09/15/19    Authorization Type BCBS    PT Start Time 1314    PT Stop Time 1358    PT Time Calculation (min) 44 min    Activity Tolerance Patient tolerated treatment well;Patient limited by fatigue    Behavior During Therapy [facility] for tasks assessed/performed           Past Medical History:  Diagnosis Date  . Anemia   . Hypertension   . Migraines   . Morbid obesity with BMI of 40.0-44.9, adult ([place])   . Thalassemia     Past Surgical History:  Procedure Laterality Date  . ABDOMINAL HYSTERECTOMY    . BREAST REDUCTION SURGERY    . CESAREAN SECTION      There were no vitals filed for this visit.   Subjective Assessment - 08/25/19 1316    Subjective Doing okay. 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    Pertinent History thalassemia, migraines, HTN, anemia    Diagnostic tests CT head and MRI brain were normal during hospitalization from 04/19-04/24    Patient Stated Goals "get back to being able to walk right"    Currently in Pain? No/denies              [facility] PT Assessment - 08/25/19 0001      Assessment   Medical Diagnosis Weakness of UEs    Referring Provider (PT) Benito Mccreedy, MD    Onset Date/Surgical Date 06/01/19      Strength   Right Shoulder Flexion 4+/5    Right Shoulder ABduction 4+/5    Right Shoulder Internal Rotation 4+/5    Right Shoulder External Rotation 4+/5    Left Shoulder Flexion 3+/5    Left Shoulder ABduction 3+/5    Left Shoulder Internal Rotation 4/5    Left Shoulder  External Rotation 4+/5    Right Elbow Flexion 4+/5    Right Elbow Extension 4+/5    Left Elbow Flexion 4-/5    Left Elbow Extension 4/5    Right Wrist Flexion 4+/5    Right Wrist Extension 4+/5    Left Wrist Flexion 4/5    Left Wrist Extension 4/5    Right Hand Grip (lbs) 30   40, 30, 20   Left Hand Grip (lbs) 13   13, 11, 15   Right Hip Flexion 5/5    Right Hip ABduction 4+/5    Right Hip ADduction 4+/5    Left Hip Flexion 4+/5    Left Hip ABduction 4/5    Left Hip ADduction 4+/5    Right Knee Flexion 4/5    Right Knee Extension 4+/5    Left Knee Flexion 4/5    Left Knee Extension 4/5    Right Ankle Dorsiflexion 4/5    Right Ankle Plantar Flexion 4+/5    Left Ankle Dorsiflexion 4-/5    Left Ankle Plantar Flexion 3+/5      Standardized Balance Assessment   Standardized Balance Assessment Five Times Sit to Stand  Five times sit to stand comments  9.89 sec without UEs                         [facility] Adult PT Treatment/Exercise - 08/25/19 0001      Knee/Hip Exercises: Aerobic   Nustep L3 x 6 min (UE/LEs)      Knee/Hip Exercises: Standing   Functional Squat 1 set;10 reps    Functional Squat Limitations mini squat at TRX   cues to shift bottom back   SLS R & L SLS at counter 30"   increased L ankle instability   Other Standing Knee Exercises decline row TRX x10   cues for scap squeeze                  PT Education - 08/25/19 1359    Education Details encouraged patient on benefits of mental health counseling to assist with GBS recovering; discussion on typical rehab course for GBS and patient's current difficulties and expectations    Person(s) Educated Patient    Methods Explanation    Comprehension Verbalized understanding            PT Short Term Goals - 08/25/19 1317      PT SHORT TERM GOAL #1   Title Patient to be independent with initial HEP.    Time 3    Period Weeks    Status Achieved    Target Date 08/25/19             PT  Long Term Goals - 08/25/19 1318      PT LONG TERM GOAL #1   Title Patient to be independent with advanced HEP.    Time 6    Period Weeks    Status On-going      PT LONG TERM GOAL #2   Title Patient to score >22/30 on FGA in order to decrease risk of falls.    Time 6    Period Weeks    Status Achieved      PT LONG TERM GOAL #3   Title Patient to demonstrate B UE strength >/=4+/5 and grip strength >40 lbs.    Time 6    Period Weeks    Status On-going      PT LONG TERM GOAL #4   Title Patient to demonstrate B LE strength >/=4+/5.    Time 6    Period Weeks    Status On-going      PT LONG TERM GOAL #5   Title Patient to score <12 sec on 5xSTS without UEs in order to decrease risk of falls.    Time 6    Period Weeks    Status Achieved   08/25/19 5xSTS in 9.89 sec without UEs     PT LONG TERM GOAL #6   Title Patient to report tolerance of 60 minutes of walking/standing without fatigue limiting.    Time 6    Period Weeks    Status On-going   reports 10-15 min                Plan - 08/25/19 1401    Clinical Impression Statement Patient still reporting considerable fatigue with short durations of walking. Reports that she is limited to 10-15 minutes of standing/walking before onset of fatigue, resulting in considerable remaining difficulty with short shopping trips. Notes that fatigue levels and ability to tolerate HEP fluctuates each day. Patient has met 5xSTS goal today, but reporting 6/10 fatigue after  this test. Patient has demonstrated improvement in R grip strength, but with L hand showing slight decline. LE strength testing revealed improvement in B hip flexion and L hip adduction, however with a decline in L ankle strength. L UE strength has also declined at this time. Encouraged patient to seek mental health counseling and discuss following up with her Neurologist with her referring physician d/t severe fatigue and strength decline since initial eval. Patient reported  understanding and without complaints at end of session besides post-exercise fatigue.    Comorbidities thalassemia, migraines, HTN, anemia    Rehab Potential Good    PT Frequency 2x / week    PT Duration 6 weeks    PT Treatment/Interventions ADLs/Self Care Home Management;Cryotherapy;Electrical Stimulation;Moist Heat;Balance training;Therapeutic exercise;Therapeutic activities;Functional mobility training;Stair training;Gait training;Ultrasound;Neuromuscular re-education;Patient/family education;Manual techniques;Vasopneumatic Device;Taping;Energy conservation;Dry needling;Passive range of motion    PT Next Visit Plan progress distal to proximal LE and UE strength    Consulted and Agree with Plan of Care Patient           Patient will benefit from skilled therapeutic intervention in order to improve the following deficits and impairments:  Decreased endurance, Cardiopulmonary status limiting activity, Decreased activity tolerance, Decreased strength, Impaired UE functional use, Pain, Decreased balance, Difficulty walking, Improper body mechanics, Postural dysfunction, Impaired flexibility  Visit Diagnosis: Muscle weakness (generalized)  Difficulty in walking, not elsewhere classified  Unsteadiness on feet     Problem List Patient Active Problem List   Diagnosis Date Noted  . Thalassemia   . Hypertension   . Morbid obesity with BMI of 40.0-44.9, adult [facility])      Janene Harvey, PT, DPT 08/25/19 6:11 PM   [facility] [address]  [address] [place], [place], [address] Phone: [phone]   Fax:  [phone]  Name: Kaitlyn Ashley MRN: [id number] Date of Birth: 07-Aug-1970

## 2019-08-27 ENCOUNTER — Ambulatory Visit: Payer: BC Managed Care – PPO | Admitting: Physical Therapy

## 2019-08-27 ENCOUNTER — Other Ambulatory Visit: Payer: Self-pay

## 2019-08-27 ENCOUNTER — Encounter: Payer: Self-pay | Admitting: Physical Therapy

## 2019-08-27 DIAGNOSIS — G43909 Migraine, unspecified, not intractable, without status migrainosus: Secondary | ICD-10-CM | POA: Diagnosis not present

## 2019-08-27 DIAGNOSIS — M6281 Muscle weakness (generalized): Secondary | ICD-10-CM

## 2019-08-27 DIAGNOSIS — R2681 Unsteadiness on feet: Secondary | ICD-10-CM | POA: Diagnosis not present

## 2019-08-27 DIAGNOSIS — F419 Anxiety disorder, unspecified: Secondary | ICD-10-CM | POA: Diagnosis not present

## 2019-08-27 DIAGNOSIS — R600 Localized edema: Secondary | ICD-10-CM | POA: Diagnosis not present

## 2019-08-27 DIAGNOSIS — R262 Difficulty in walking, not elsewhere classified: Secondary | ICD-10-CM | POA: Diagnosis not present

## 2019-08-27 DIAGNOSIS — I10 Essential (primary) hypertension: Secondary | ICD-10-CM | POA: Diagnosis not present

## 2019-08-27 NOTE — Therapy (Signed)
[facility] [address]  [address] [place], [place], [address] Phone: [phone]   Fax:  [phone]  Physical Therapy Treatment  Patient Details  Name: Kaitlyn Ashley MRN: [id number] Date of Birth: [birth year] Referring Provider (PT): Jackie Plum, MD   Encounter Date: 08/27/2019   PT End of Session - 08/27/19 1356    Visit Number 6    Number of Visits 13    Date for PT Re-Evaluation 09/15/19    Authorization Type BCBS    PT Start Time 1311    PT Stop Time 1357    PT Time Calculation (min) 46 min    Activity Tolerance Patient tolerated treatment well;Patient limited by fatigue    Behavior During Therapy [facility] for tasks assessed/performed           Past Medical History:  Diagnosis Date  . Anemia   . Hypertension   . Migraines   . Morbid obesity with BMI of 40.0-44.9, adult (HCC)   . Thalassemia     Past Surgical History:  Procedure Laterality Date  . ABDOMINAL HYSTERECTOMY    . BREAST REDUCTION SURGERY    . CESAREAN SECTION      There were no vitals filed for this visit.   Subjective Assessment - 08/27/19 1313    Subjective Went to see her PA and will be seeing Dr. Jerrell Belfast again for a f/u appointment. Denies episodes of N/T since last session, but still having throbbing pain down the L LE.    Pertinent History thalassemia, migraines, HTN, anemia    Diagnostic tests CT head and MRI brain were normal during hospitalization from 04/19-04/24    Patient Stated Goals "get back to being able to walk right"    Currently in Pain? Yes    Pain Score 4     Pain Location Leg    Pain Orientation Left    Pain Descriptors / Indicators Throbbing                             [facility] Adult PT Treatment/Exercise - 08/27/19 0001      Neuro Re-ed    Neuro Re-ed Details  L SLS on foam 2x30", anterior and backwards tandem walk along counter x2       Knee/Hip Exercises: Aerobic   Nustep L4 x 6 min (UE/LEs)       Knee/Hip Exercises: Machines for Strengthening   Cybex Knee Extension L LE 10# 2x10   good control   Cybex Knee Flexion L LE 10# 2x10      Knee/Hip Exercises: Standing   Forward Step Up Left;1 set;10 reps;Hand Hold: 0;Step Height: 6"    Forward Step Up Limitations L LE step up, back    difficuilty ascending      Wrist Exercises   Other wrist exercises L lateral pinch grip with green putty x10    Other wrist exercises digiflex 5# grip x10       Ankle Exercises: Seated   Other Seated Ankle Exercises x10 L ankle inversion, eversion, DF with yellow TB      Ankle Exercises: Stretches   Other Stretch L plantarflexion stretch 30" in sitting                    PT Short Term Goals - 08/25/19 1317      PT SHORT TERM GOAL #1   Title Patient to be independent with initial  HEP.    Time 3    Period Weeks    Status Achieved    Target Date 08/25/19             PT Long Term Goals - 08/25/19 1318      PT LONG TERM GOAL #1   Title Patient to be independent with advanced HEP.    Time 6    Period Weeks    Status On-going      PT LONG TERM GOAL #2   Title Patient to score >22/30 on FGA in order to decrease risk of falls.    Time 6    Period Weeks    Status Achieved      PT LONG TERM GOAL #3   Title Patient to demonstrate B UE strength >/=4+/5 and grip strength >40 lbs.    Time 6    Period Weeks    Status On-going      PT LONG TERM GOAL #4   Title Patient to demonstrate B LE strength >/=4+/5.    Time 6    Period Weeks    Status On-going      PT LONG TERM GOAL #5   Title Patient to score <12 sec on 5xSTS without UEs in order to decrease risk of falls.    Time 6    Period Weeks    Status Achieved   08/25/19 5xSTS in 9.89 sec without UEs     PT LONG TERM GOAL #6   Title Patient to report tolerance of 60 minutes of walking/standing without fatigue limiting.    Time 6    Period Weeks    Status On-going   reports 10-15 min                Plan -  08/27/19 1357    Clinical Impression Statement Patient noting that she saw her PA who is referring her back to her Neurologist, Dr. Jerrell Belfast. Denies any other episodes of L LE N/T but does report night pain in the L lower leg. Patient demonstrated good ROM and muscle control with resisted ankle strengthening, but reported difficulty. Patient demonstrated good motivation to perform ther-ex today despite difficulty and fatigue. Machine strengthening was initiated with good tolerance. Significant L ankle instability demonstrated with SLS on compliant surface, indicating ankle weakness. Patient reporting difficulty ascending steps using L LE, thus worked on using L LE dominant pattern. Ended session working on grip strength to address remaining weakness. No complaints at end of session.    Comorbidities thalassemia, migraines, HTN, anemia    Rehab Potential Good    PT Frequency 2x / week    PT Duration 6 weeks    PT Treatment/Interventions ADLs/Self Care Home Management;Cryotherapy;Electrical Stimulation;Moist Heat;Balance training;Therapeutic exercise;Therapeutic activities;Functional mobility training;Stair training;Gait training;Ultrasound;Neuromuscular re-education;Patient/family education;Manual techniques;Vasopneumatic Device;Taping;Energy conservation;Dry needling;Passive range of motion    PT Next Visit Plan progress distal to proximal LE and UE strength    Consulted and Agree with Plan of Care Patient           Patient will benefit from skilled therapeutic intervention in order to improve the following deficits and impairments:  Decreased endurance, Cardiopulmonary status limiting activity, Decreased activity tolerance, Decreased strength, Impaired UE functional use, Pain, Decreased balance, Difficulty walking, Improper body mechanics, Postural dysfunction, Impaired flexibility  Visit Diagnosis: Muscle weakness (generalized)  Difficulty in walking, not elsewhere classified  Unsteadiness on  feet     Problem List Patient Active Problem List   Diagnosis Date Noted  . Thalassemia   .  Hypertension   . Morbid obesity with BMI of 40.0-44.9, adult [facility])      Anette Guarneri, PT, DPT 08/27/19 2:01 PM   [facility] [address]  [address] [place], [place], [address] Phone: [phone]   Fax:  [phone]  Name: Kaitlyn Ashley MRN: [id number] Date of Birth: 09-26-1970

## 2019-08-31 ENCOUNTER — Ambulatory Visit: Payer: BC Managed Care – PPO | Admitting: Physical Therapy

## 2019-09-01 DIAGNOSIS — R600 Localized edema: Secondary | ICD-10-CM | POA: Diagnosis not present

## 2019-09-01 DIAGNOSIS — G43909 Migraine, unspecified, not intractable, without status migrainosus: Secondary | ICD-10-CM | POA: Diagnosis not present

## 2019-09-01 DIAGNOSIS — F419 Anxiety disorder, unspecified: Secondary | ICD-10-CM | POA: Diagnosis not present

## 2019-09-01 DIAGNOSIS — I10 Essential (primary) hypertension: Secondary | ICD-10-CM | POA: Diagnosis not present

## 2019-09-03 ENCOUNTER — Encounter: Payer: Self-pay | Admitting: Physical Therapy

## 2019-09-03 ENCOUNTER — Other Ambulatory Visit: Payer: Self-pay

## 2019-09-03 ENCOUNTER — Ambulatory Visit: Payer: BC Managed Care – PPO | Admitting: Physical Therapy

## 2019-09-03 DIAGNOSIS — R2681 Unsteadiness on feet: Secondary | ICD-10-CM | POA: Diagnosis not present

## 2019-09-03 DIAGNOSIS — R262 Difficulty in walking, not elsewhere classified: Secondary | ICD-10-CM

## 2019-09-03 DIAGNOSIS — M6281 Muscle weakness (generalized): Secondary | ICD-10-CM | POA: Diagnosis not present

## 2019-09-03 NOTE — Therapy (Signed)
[facility] [address]  [address] [place], [place], [address] Phone: [phone]   Fax:  [phone]  Physical Therapy Treatment  Patient Details  Name: Kaitlyn Ashley MRN: [id number] Date of Birth: 1970-07-21 Referring Provider (PT): Jackie Plum, MD   Encounter Date: 09/03/2019   PT End of Session - 09/03/19 1355    Visit Number 7    Number of Visits 13    Date for PT Re-Evaluation 09/15/19    Authorization Type BCBS    PT Start Time 1311    PT Stop Time 1353    PT Time Calculation (min) 42 min    Activity Tolerance Patient tolerated treatment well    Behavior During Therapy [facility] for tasks assessed/performed           Past Medical History:  Diagnosis Date  . Anemia   . Hypertension   . Migraines   . Morbid obesity with BMI of 40.0-44.9, adult (HCC)   . Thalassemia     Past Surgical History:  Procedure Laterality Date  . ABDOMINAL HYSTERECTOMY    . BREAST REDUCTION SURGERY    . CESAREAN SECTION      There were no vitals filed for this visit.   Subjective Assessment - 09/03/19 1312    Subjective Apologetic for missing last appointment- she overslept. Had another episode of N/T in B feet last night which caused a fall. No injuries.    Pertinent History thalassemia, migraines, HTN, anemia    Diagnostic tests CT head and MRI brain were normal during hospitalization from 04/19-04/24    Patient Stated Goals "get back to being able to walk right"    Currently in Pain? Yes    Pain Score 4     Pain Location Leg    Pain Orientation Left    Pain Descriptors / Indicators Throbbing    Pain Type Acute pain                             [facility] Adult PT Treatment/Exercise - 09/03/19 0001      Exercises   Exercises Shoulder      Knee/Hip Exercises: Aerobic   Nustep L4 x 6 min (UE/LEs)      Knee/Hip Exercises: Standing   Terminal Knee Extension Strengthening;Left;1 set;10 reps;Theraband     Theraband Level (Terminal Knee Extension) Level 3 (Green)    Terminal Knee Extension Limitations 10x3" with 1 UE support on chair      Knee/Hip Exercises: Seated   Hamstring Curl Strengthening;Left;1 set;10 reps    Hamstring Limitations green TB   report red TB too easy     Shoulder Exercises: Seated   Flexion Strengthening;Both;5 reps;Weights    Flexion Weight (lbs) 2x5 with 1# each UE    Abduction Strengthening;Both;5 reps;Weights    ABduction Limitations 2x5 with 1# each UE    Other Seated Exercises B shoulder scaption with 1# 2x5   cues for scap retraction     Wrist Exercises   Wrist Flexion Strengthening;Left;10 reps;Seated;Bar weights/barbell    Bar Weights/Barbell (Wrist Flexion) 2 lbs;3 lbs    Wrist Flexion Limitations 10x 2#, 10x eccentric 3#    Wrist Extension Strengthening;Left;10 reps    Bar Weights/Barbell (Wrist Extension) 2 lbs;3 lbs    Wrist Extension Limitations 10x 2#, 10x eccentric 3#      Ankle Exercises: Standing   Heel Raises Both;10 reps   10x B LEs, 2x5  L LE     Ankle Exercises: Stretches   Gastroc Stretch 2 reps;30 seconds   t counter                 PT Education - 09/03/19 1354    Education Details update to HEP    Person(s) Educated Patient    Methods Explanation;Demonstration;Tactile cues;Verbal cues;Handout    Comprehension Verbalized understanding;Returned demonstration            PT Short Term Goals - 08/25/19 1317      PT SHORT TERM GOAL #1   Title Patient to be independent with initial HEP.    Time 3    Period Weeks    Status Achieved    Target Date 08/25/19             PT Long Term Goals - 08/25/19 1318      PT LONG TERM GOAL #1   Title Patient to be independent with advanced HEP.    Time 6    Period Weeks    Status On-going      PT LONG TERM GOAL #2   Title Patient to score >22/30 on FGA in order to decrease risk of falls.    Time 6    Period Weeks    Status Achieved      PT LONG TERM GOAL #3   Title  Patient to demonstrate B UE strength >/=4+/5 and grip strength >40 lbs.    Time 6    Period Weeks    Status On-going      PT LONG TERM GOAL #4   Title Patient to demonstrate B LE strength >/=4+/5.    Time 6    Period Weeks    Status On-going      PT LONG TERM GOAL #5   Title Patient to score <12 sec on 5xSTS without UEs in order to decrease risk of falls.    Time 6    Period Weeks    Status Achieved   08/25/19 5xSTS in 9.89 sec without UEs     PT LONG TERM GOAL #6   Title Patient to report tolerance of 60 minutes of walking/standing without fatigue limiting.    Time 6    Period Weeks    Status On-going   reports 10-15 min                Plan - 09/03/19 1355    Clinical Impression Statement Patient reported another episode of N/T in B plantar surface of feet last night, causing a fall when she tried to stand up from a chair, but notes no injuries. Initiated B shoulder strengthening with light weight, with patient demonstrating limited ROM d/t weakness and cueing to retract scapulae. Patient reported slight discomfort in L shoulder, thus transitioned to wrist strengthening with ability to perform eccentric strengthening with increased weight. Patient with seemingly better ability to tolerate ther-ex today d/t decreased fatigue while performing sitting exercises. However, limited ROM demonstrated with L single leg heel raises d/t weakness. Updated this exercise into HEP as this was well-tolerated today. No complaints at end of session. Patient is progressing towards goals.    Comorbidities thalassemia, migraines, HTN, anemia    Rehab Potential Good    PT Frequency 2x / week    PT Duration 6 weeks    PT Treatment/Interventions ADLs/Self Care Home Management;Cryotherapy;Electrical Stimulation;Moist Heat;Balance training;Therapeutic exercise;Therapeutic activities;Functional mobility training;Stair training;Gait training;Ultrasound;Neuromuscular re-education;Patient/family  education;Manual techniques;Vasopneumatic Device;Taping;Energy conservation;Dry needling;Passive range of motion  PT Next Visit Plan progress distal to proximal LE and UE strength    Consulted and Agree with Plan of Care Patient           Patient will benefit from skilled therapeutic intervention in order to improve the following deficits and impairments:  Decreased endurance, Cardiopulmonary status limiting activity, Decreased activity tolerance, Decreased strength, Impaired UE functional use, Pain, Decreased balance, Difficulty walking, Improper body mechanics, Postural dysfunction, Impaired flexibility  Visit Diagnosis: Muscle weakness (generalized)  Difficulty in walking, not elsewhere classified  Unsteadiness on feet     Problem List Patient Active Problem List   Diagnosis Date Noted  . Thalassemia   . Hypertension   . Morbid obesity with BMI of 40.0-44.9, adult [facility])     Anette Guarneri, PT, DPT 09/03/19 1:57 PM   [facility] [address]  [address] [place], [place], [address] Phone: [phone]   Fax:  [phone]  Name: Kaitlyn Ashley MRN: [id number] Date of Birth: 02/04/1971

## 2019-09-07 ENCOUNTER — Ambulatory Visit: Payer: BC Managed Care – PPO | Admitting: Physical Therapy

## 2019-09-09 ENCOUNTER — Ambulatory Visit: Payer: BC Managed Care – PPO | Admitting: Physical Therapy

## 2019-09-09 ENCOUNTER — Encounter: Payer: Self-pay | Admitting: Physical Therapy

## 2019-09-09 ENCOUNTER — Other Ambulatory Visit: Payer: Self-pay

## 2019-09-09 DIAGNOSIS — R262 Difficulty in walking, not elsewhere classified: Secondary | ICD-10-CM

## 2019-09-09 DIAGNOSIS — R2681 Unsteadiness on feet: Secondary | ICD-10-CM

## 2019-09-09 DIAGNOSIS — M6281 Muscle weakness (generalized): Secondary | ICD-10-CM

## 2019-09-09 NOTE — Therapy (Signed)
[facility] [address]  [address] [place], [place], [address] Phone: [phone]   Fax:  [phone]  Physical Therapy Treatment  Patient Details  Name: Kaitlyn Ashley MRN: [id number] Date of Birth: [birth year] Referring Provider (PT): Jackie Plum, MD   Encounter Date: 09/09/2019   PT End of Session - 09/09/19 0932    Visit Number 8    Number of Visits 13    Date for PT Re-Evaluation 09/15/19    Authorization Type BCBS    PT Start Time 0844    PT Stop Time 0928    PT Time Calculation (min) 44 min    Activity Tolerance Patient tolerated treatment well    Behavior During Therapy [facility] for tasks assessed/performed           Past Medical History:  Diagnosis Date  . Anemia   . Hypertension   . Migraines   . Morbid obesity with BMI of 40.0-44.9, adult (HCC)   . Thalassemia     Past Surgical History:  Procedure Laterality Date  . ABDOMINAL HYSTERECTOMY    . BREAST REDUCTION SURGERY    . CESAREAN SECTION      There were no vitals filed for this visit.   Subjective Assessment - 09/09/19 0845    Subjective Not much new except that her work days were adjusted to be longer. Got off at work at 9pm yesterday and is struggling with fatigue.    Pertinent History thalassemia, migraines, HTN, anemia    Diagnostic tests CT head and MRI brain were normal during hospitalization from 04/19-04/24    Patient Stated Goals "get back to being able to walk right"    Currently in Pain? Yes    Pain Score 3     Pain Location Leg    Pain Orientation Left    Pain Descriptors / Indicators Throbbing    Pain Type Acute pain                             [facility] Adult PT Treatment/Exercise - 09/09/19 0001      Neuro Re-ed    Neuro Re-ed Details  forward/backward tandem walk 4x length of counter, heel/toe walk along counter top 4x length of counter   ankle instability- worse on L     Exercises   Exercises Wrist       Knee/Hip Exercises: Stretches   Gastroc Stretch Right;Left;1 rep;30 seconds    Gastroc Stretch Limitations runner's stretch      Knee/Hip Exercises: Aerobic   Nustep L4 x 6 min (UE/LEs)      Shoulder Exercises: Seated   External Rotation Strengthening;Left;10 reps;Theraband    Theraband Level (Shoulder External Rotation) Level 2 (Red)    External Rotation Limitations cues to stop at neutral and keep elbow at side    Internal Rotation Strengthening;Left;10 reps;Theraband    Theraband Level (Shoulder Internal Rotation) Level 2 (Red)    Internal Rotation Limitations cues to stop at neutral and keep elbow at side      Wrist Exercises   Wrist Flexion Strengthening;Left;10 reps;Seated;Theraband    Theraband Level (Wrist Flexion) Level 2 (Red)    Wrist Flexion Limitations isometric shoulder IR + resisted wrist flexion    Wrist Extension Strengthening;Left;10 reps;Theraband    Theraband Level (Wrist Extension) Level 2 (Red)    Wrist Extension Limitations isometric shoulder ER + resisted wrist extension      Ankle Exercises:  Standing   Heel Raises Both;10 reps   2x10 eccentric heel raises at UBE     Ankle Exercises: Seated   Other Seated Ankle Exercises L ankle dorsiflexion with red TB x15                  PT Education - 09/09/19 0929    Education Details update to HEP    Person(s) Educated Patient    Methods Explanation;Demonstration;Tactile cues;Verbal cues;Handout    Comprehension Verbalized understanding;Returned demonstration            PT Short Term Goals - 08/25/19 1317      PT SHORT TERM GOAL #1   Title Patient to be independent with initial HEP.    Time 3    Period Weeks    Status Achieved    Target Date 08/25/19             PT Long Term Goals - 08/25/19 1318      PT LONG TERM GOAL #1   Title Patient to be independent with advanced HEP.    Time 6    Period Weeks    Status On-going      PT LONG TERM GOAL #2   Title Patient to score >22/30 on  FGA in order to decrease risk of falls.    Time 6    Period Weeks    Status Achieved      PT LONG TERM GOAL #3   Title Patient to demonstrate B UE strength >/=4+/5 and grip strength >40 lbs.    Time 6    Period Weeks    Status On-going      PT LONG TERM GOAL #4   Title Patient to demonstrate B LE strength >/=4+/5.    Time 6    Period Weeks    Status On-going      PT LONG TERM GOAL #5   Title Patient to score <12 sec on 5xSTS without UEs in order to decrease risk of falls.    Time 6    Period Weeks    Status Achieved   08/25/19 5xSTS in 9.89 sec without UEs     PT LONG TERM GOAL #6   Title Patient to report tolerance of 60 minutes of walking/standing without fatigue limiting.    Time 6    Period Weeks    Status On-going   reports 10-15 min                Plan - 09/09/19 0933    Clinical Impression Statement Patient reported a that her work hours were extended, causing her to have more trouble with fatigue. Worked on progressive shoulder and wrist strengthening with banded resistance with patient reporting most difficulty with ER and wrist extension. Heel raises were progressed with eccentric lowering with patient reporting difficulty but tolerated this well. Dynamic balance exercises were completed with patient demonstrating increased ankle instability, L>R. Patient also demonstrated more antalgic pattern and c/o L lower leg discomfort with heel vs toe walking. Worked on progressing dorsiflexion strength in sitting, with patient tolerating increased resistance without pain. Updated HEP with exercises that were well-tolerated today. Patient reported understanding and without complaints at end of session. Patient is demonstrating good improvement in fatigue with recent sessions.    Comorbidities thalassemia, migraines, HTN, anemia    Rehab Potential Good    PT Frequency 2x / week    PT Duration 6 weeks    PT Treatment/Interventions ADLs/Self Care Home  Management;Cryotherapy;Electrical Stimulation;Moist  Heat;Balance training;Therapeutic exercise;Therapeutic activities;Functional mobility training;Stair training;Gait training;Ultrasound;Neuromuscular re-education;Patient/family education;Manual techniques;Vasopneumatic Device;Taping;Energy conservation;Dry needling;Passive range of motion    PT Next Visit Plan progress distal to proximal LE and UE strength    Consulted and Agree with Plan of Care Patient           Patient will benefit from skilled therapeutic intervention in order to improve the following deficits and impairments:  Decreased endurance, Cardiopulmonary status limiting activity, Decreased activity tolerance, Decreased strength, Impaired UE functional use, Pain, Decreased balance, Difficulty walking, Improper body mechanics, Postural dysfunction, Impaired flexibility  Visit Diagnosis: Muscle weakness (generalized)  Difficulty in walking, not elsewhere classified  Unsteadiness on feet     Problem List Patient Active Problem List   Diagnosis Date Noted  . Thalassemia   . Hypertension   . Morbid obesity with BMI of 40.0-44.9, adult [facility])     Anette Guarneri, PT, DPT 09/09/19 9:35 AM   [facility] [address]  [address] [place], [place], [address] Phone: [phone]   Fax:  [phone]  Name: Kaitlyn Ashley MRN: [id number] Date of Birth: 1970/08/27

## 2019-09-10 ENCOUNTER — Ambulatory Visit: Payer: BC Managed Care – PPO | Admitting: Physical Therapy

## 2019-09-11 ENCOUNTER — Other Ambulatory Visit: Payer: Self-pay

## 2019-09-11 ENCOUNTER — Encounter: Payer: Self-pay | Admitting: Physical Therapy

## 2019-09-11 ENCOUNTER — Ambulatory Visit: Payer: BC Managed Care – PPO | Admitting: Physical Therapy

## 2019-09-11 DIAGNOSIS — R262 Difficulty in walking, not elsewhere classified: Secondary | ICD-10-CM

## 2019-09-11 DIAGNOSIS — M6281 Muscle weakness (generalized): Secondary | ICD-10-CM

## 2019-09-11 DIAGNOSIS — R2681 Unsteadiness on feet: Secondary | ICD-10-CM

## 2019-09-11 NOTE — Therapy (Signed)
[facility] [address]  [address] [place], [place], [address] Phone: [phone]   Fax:  [phone]  Physical Therapy Treatment  Patient Details  Name: Kaitlyn Ashley MRN: [id number] Date of Birth: 09-30-1970 Referring Provider (PT): Jackie Plum, MD   Encounter Date: 09/11/2019   PT End of Session - 09/11/19 0925    Visit Number 9    Number of Visits 13    Date for PT Re-Evaluation 09/15/19    Authorization Type BCBS    PT Start Time 0845    PT Stop Time 0925    PT Time Calculation (min) 40 min    Activity Tolerance Patient tolerated treatment well    Behavior During Therapy [facility] for tasks assessed/performed           Past Medical History:  Diagnosis Date  . Anemia   . Hypertension   . Migraines   . Morbid obesity with BMI of 40.0-44.9, adult (HCC)   . Thalassemia     Past Surgical History:  Procedure Laterality Date  . ABDOMINAL HYSTERECTOMY    . BREAST REDUCTION SURGERY    . CESAREAN SECTION      There were no vitals filed for this visit.   Subjective Assessment - 09/11/19 0846    Subjective Not much is new.    Pertinent History thalassemia, migraines, HTN, anemia    Diagnostic tests CT head and MRI brain were normal during hospitalization from 04/19-04/24    Patient Stated Goals "get back to being able to walk right"    Currently in Pain? Yes    Pain Score 3     Pain Location Leg    Pain Orientation Left    Pain Descriptors / Indicators Throbbing    Pain Type Acute pain                             [facility] Adult PT Treatment/Exercise - 09/11/19 0001      Exercises   Exercises Hand      Knee/Hip Exercises: Aerobic   Nustep L5 x 6 min (UE/LEs)      Knee/Hip Exercises: Seated   Sit to Sand 2 sets;without UE support;10 reps   1st set with red loop above knees; 2nd set with red TB TKE o     Hand Exercises   Other Hand Exercises L hand farmer's carry with 5# dumbell with  towel roll around handle 2 circles around track   c/o full body fatigue     Wrist Exercises   Wrist Flexion Strengthening;Left;Seated;Theraband;15 reps    Theraband Level (Wrist Flexion) Level 2 (Red)    Wrist Flexion Limitations isometric shoulder IR + wrist flexion    Wrist Extension Strengthening;Left;Theraband;15 reps    Theraband Level (Wrist Extension) Level 2 (Red)    Wrist Extension Limitations isometric shoulder ER + wrist extension    Wrist Radial Deviation Strengthening;Left;15 reps;Standing    Wrist Radial Deviation Limitations distal handle of hammer    Wrist Ulnar Deviation Strengthening;Left;15 reps;Standing    Wrist Ulnar Deviation Limitations distal handle of hammer      Ankle Exercises: Seated   Other Seated Ankle Exercises L 4 way ankle with red TB x10 each                    PT Short Term Goals - 08/25/19 1317      PT SHORT TERM GOAL #1  Title Patient to be independent with initial HEP.    Time 3    Period Weeks    Status Achieved    Target Date 08/25/19             PT Long Term Goals - 08/25/19 1318      PT LONG TERM GOAL #1   Title Patient to be independent with advanced HEP.    Time 6    Period Weeks    Status On-going      PT LONG TERM GOAL #2   Title Patient to score >22/30 on FGA in order to decrease risk of falls.    Time 6    Period Weeks    Status Achieved      PT LONG TERM GOAL #3   Title Patient to demonstrate B UE strength >/=4+/5 and grip strength >40 lbs.    Time 6    Period Weeks    Status On-going      PT LONG TERM GOAL #4   Title Patient to demonstrate B LE strength >/=4+/5.    Time 6    Period Weeks    Status On-going      PT LONG TERM GOAL #5   Title Patient to score <12 sec on 5xSTS without UEs in order to decrease risk of falls.    Time 6    Period Weeks    Status Achieved   08/25/19 5xSTS in 9.89 sec without UEs     PT LONG TERM GOAL #6   Title Patient to report tolerance of 60 minutes of  walking/standing without fatigue limiting.    Time 6    Period Weeks    Status On-going   reports 10-15 min                Plan - 09/11/19 0926    Clinical Impression Statement Patient without new complaints today. Worked on wrist strengthening with increased reps with good effort to maintain muscle control and slow speed throughout. Worked on 4 way ankle with increased resistance with patient demonstrating good tolerance and no fatigue. Patient still noting difficulty with gripping activities with left hand, thus challenged gripping endurance with farmer's carry. Patient demonstrated increased L knee extensor thrust with ambulation and reported c/o full body fatigue. After sitting rest break, proceeded to continue with STS training with patient requiring minor cues to maintain fully upright standing posture. Ended session without complaints. Patient demonstrating good effort with sessions, but still dealing with fatigue with standing ther-ex.    Comorbidities thalassemia, migraines, HTN, anemia    Rehab Potential Good    PT Frequency 2x / week    PT Duration 6 weeks    PT Treatment/Interventions ADLs/Self Care Home Management;Cryotherapy;Electrical Stimulation;Moist Heat;Balance training;Therapeutic exercise;Therapeutic activities;Functional mobility training;Stair training;Gait training;Ultrasound;Neuromuscular re-education;Patient/family education;Manual techniques;Vasopneumatic Device;Taping;Energy conservation;Dry needling;Passive range of motion    PT Next Visit Plan progress distal to proximal LE and UE strength    Consulted and Agree with Plan of Care Patient           Patient will benefit from skilled therapeutic intervention in order to improve the following deficits and impairments:  Decreased endurance, Cardiopulmonary status limiting activity, Decreased activity tolerance, Decreased strength, Impaired UE functional use, Pain, Decreased balance, Difficulty walking, Improper  body mechanics, Postural dysfunction, Impaired flexibility  Visit Diagnosis: Muscle weakness (generalized)  Difficulty in walking, not elsewhere classified  Unsteadiness on feet     Problem List Patient Active Problem List   Diagnosis Date  Noted  . Thalassemia   . Hypertension   . Morbid obesity with BMI of 40.0-44.9, adult [facility])      Anette Guarneri, PT, DPT 09/11/19 9:27 AM   [facility] [address]  [address] [place], [place], [address] Phone: [phone]   Fax:  [phone]  Name: Kaitlyn Ashley MRN: [id number] Date of Birth: 1970-05-18

## 2019-09-14 ENCOUNTER — Encounter: Payer: Self-pay | Admitting: Physical Therapy

## 2019-09-14 ENCOUNTER — Other Ambulatory Visit: Payer: Self-pay

## 2019-09-14 ENCOUNTER — Ambulatory Visit: Payer: BC Managed Care – PPO | Admitting: Physical Therapy

## 2019-09-14 DIAGNOSIS — M6281 Muscle weakness (generalized): Secondary | ICD-10-CM

## 2019-09-14 DIAGNOSIS — R262 Difficulty in walking, not elsewhere classified: Secondary | ICD-10-CM

## 2019-09-14 DIAGNOSIS — R2681 Unsteadiness on feet: Secondary | ICD-10-CM | POA: Diagnosis not present

## 2019-09-14 NOTE — Therapy (Signed)
[facility] [address]  [place], [place], [address] Phone: [phone]   Fax:  [phone]  Physical Therapy Progress Note  Patient Details  Name: Kaitlyn Ashley MRN: [id number] Date of Birth: [birth year] Referring Provider (PT): Benito Mccreedy, MD   Encounter Date: 09/14/2019   PT End of Session - 09/14/19 1402    Visit Number 10    Number of Visits 22    Date for PT Re-Evaluation 10/26/19    Authorization Type BCBS    PT Start Time 1311    PT Stop Time 1400    PT Time Calculation (min) 49 min    Activity Tolerance Patient tolerated treatment well    Behavior During Therapy [facility] for tasks assessed/performed           Past Medical History:  Diagnosis Date  . Anemia   . Hypertension   . Migraines   . Morbid obesity with BMI of 40.0-44.9, adult ([place])   . Thalassemia     Past Surgical History:  Procedure Laterality Date  . ABDOMINAL HYSTERECTOMY    . BREAST REDUCTION SURGERY    . CESAREAN SECTION      There were no vitals filed for this visit.   Subjective Assessment - 09/14/19 1312    Subjective Still following up with PCP on her neurology referral. Had an outing on Friday which she sat down for the majority of. Started to notice her L hand freezing up on her intermittenly over the weekend. Legs have not been bothering her today. Feeling like she is improving in pain levels, getting into the shower. Still struggling with increased fatigue levels.    Pertinent History thalassemia, migraines, HTN, anemia    Diagnostic tests CT head and MRI brain were normal during hospitalization from 04/19-04/24    Patient Stated Goals "get back to being able to walk right"    Currently in Pain? No/denies              [facility] PT Assessment - 09/14/19 0001      Assessment   Medical Diagnosis Weakness of UEs    Referring Provider (PT) Benito Mccreedy, MD    Onset Date/Surgical Date 06/01/19      Strength    Overall Strength Comments L LE wavering resistance    Left Shoulder Flexion 4/5    Left Shoulder ABduction 4/5    Left Shoulder Internal Rotation 4/5    Left Shoulder External Rotation 4+/5    Right Elbow Flexion 4+/5    Right Elbow Extension 4+/5    Left Elbow Flexion 4/5    Left Elbow Extension 4-/5    Right Wrist Flexion 4+/5    Right Wrist Extension 4+/5    Left Wrist Flexion 4+/5    Left Wrist Extension 4/5    Right Hand Grip (lbs) 36   35, 33, 40   Left Hand Grip (lbs) 5.67   7, 5, 5   Right Hip Flexion 5/5    Right Hip ABduction 4+/5    Right Hip ADduction 4+/5    Left Hip Flexion 4+/5    Left Hip ABduction 4/5    Left Hip ADduction 4+/5    Right Knee Flexion 4+/5    Right Knee Extension 4+/5    Left Knee Flexion 4+/5    Left Knee Extension 4/5    Right Ankle Dorsiflexion 4/5    Right Ankle Plantar Flexion 4+/5    Left Ankle Dorsiflexion 4/5  Left Ankle Plantar Flexion 4/5                         [facility] Adult PT Treatment/Exercise - 09/14/19 0001      Knee/Hip Exercises: Aerobic   Nustep L5 x 6 min (UE/LEs)      Knee/Hip Exercises: Standing   Terminal Knee Extension Strengthening;Left;1 set;10 reps;Theraband    Theraband Level (Terminal Knee Extension) Level 3 (Green)    Terminal Knee Extension Limitations 10x5" with 1 UE support on chair   good speed   Other Standing Knee Exercises sidestepping with yellow loop around toes 4x length of counter    c/o muscel fatigue     Ankle Exercises: Standing   Heel Raises Left;10 reps   single leg heel raise                 PT Education - 09/14/19 1401    Education Details update to HEP; discussion on objective progress and remaining impairments    Person(s) Educated Patient    Methods Explanation;Demonstration;Tactile cues;Verbal cues;Handout    Comprehension Verbalized understanding;Returned demonstration            PT Short Term Goals - 09/14/19 1319      PT SHORT TERM GOAL #1   Title  Patient to be independent with initial HEP.    Time 3    Period Weeks    Status Achieved    Target Date 08/25/19             PT Long Term Goals - 09/14/19 1319      PT LONG TERM GOAL #1   Title Patient to be independent with advanced HEP.    Time 6    Period Weeks    Status Partially Met   met for current   Target Date 10/26/19      PT LONG TERM GOAL #2   Title Patient to score >22/30 on FGA in order to decrease risk of falls.    Time 6    Period Weeks    Status Achieved      PT LONG TERM GOAL #3   Title Patient to demonstrate B UE strength >/=4+/5 and grip strength >40 lbs.    Time 6    Period Weeks    Status On-going   improving in L shoulder flexion and abduction, L elbow and wrist flexion but decrease in L hand grip strength   Target Date 10/26/19      PT LONG TERM GOAL #4   Title Patient to demonstrate B LE strength >/=4+/5.    Time 6    Period Weeks    Status Partially Met   improvement in B knee flexion and L ankle plantarflexion and dorsiflexion strength   Target Date 10/26/19      PT LONG TERM GOAL #5   Title Patient to score <12 sec on 5xSTS without UEs in order to decrease risk of falls.    Time 6    Period Weeks    Status Achieved   08/25/19 5xSTS in 9.89 sec without UEs     PT LONG TERM GOAL #6   Title Patient to report tolerance of 60 minutes of walking/standing without fatigue limiting.    Time 6    Period Weeks    Status On-going   reports 10-15 min   Target Date 10/26/19                 Plan -  09/14/19 1544    Clinical Impression Statement Patient reporting improvement in L LE pain levels and ability to transfer into the shower since starting therapy. However, still notes increased fatigue levels which pose a challenge to her ability to perform ADLs and work tasks. Patient reports that she is still limited to 10-15 min of standing/walking d/t fatigue. Strength testing revealed an improvement in L shoulder flexion and abduction as well as  L elbow and wrist flexion, but with a progressive decrease in L hand grip strength. LE strength has improved in B knee flexion and L ankle plantarflexion and dorsiflexion strength. Patient does still note intermittently dropping objects d/t L hand weakness which is consistent with continued decline in L grip strength. Administered green theraputty for grip strengthening at home. Also worked on progressive standing LE strengthening ther-ex which was well-tolerated and required no sitting rest breaks. Updated these exercises into HEP- patient reported understanding. No complaints at end of session. Patient is demonstrating good progress towards goals with exception of L grip strength. Would benefit from continued skilled PT services 2x/week for 6 weeks to address remaining goals.    Comorbidities thalassemia, migraines, HTN, anemia    Rehab Potential Good    PT Frequency 2x / week    PT Duration 6 weeks    PT Treatment/Interventions ADLs/Self Care Home Management;Cryotherapy;Electrical Stimulation;Moist Heat;Balance training;Therapeutic exercise;Therapeutic activities;Functional mobility training;Stair training;Gait training;Ultrasound;Neuromuscular re-education;Patient/family education;Manual techniques;Vasopneumatic Device;Taping;Energy conservation;Dry needling;Passive range of motion    PT Next Visit Plan progress distal to proximal LE and UE strength    Consulted and Agree with Plan of Care Patient           Patient will benefit from skilled therapeutic intervention in order to improve the following deficits and impairments:  Decreased endurance, Cardiopulmonary status limiting activity, Decreased activity tolerance, Decreased strength, Impaired UE functional use, Pain, Decreased balance, Difficulty walking, Improper body mechanics, Postural dysfunction, Impaired flexibility  Visit Diagnosis: Muscle weakness (generalized)  Difficulty in walking, not elsewhere classified  Unsteadiness on  feet     Problem List Patient Active Problem List   Diagnosis Date Noted  . Thalassemia   . Hypertension   . Morbid obesity with BMI of 40.0-44.9, adult [facility])      Janene Harvey, PT, DPT 09/14/19 3:48 PM   [facility] [address]  [address] [place], [place], [address] Phone: [phone]   Fax:  [phone]  Name: Kaitlyn Ashley MRN: [id number] Date of Birth: [birth year]

## 2019-09-17 ENCOUNTER — Ambulatory Visit: Payer: BC Managed Care – PPO

## 2019-09-21 ENCOUNTER — Ambulatory Visit: Payer: BC Managed Care – PPO | Attending: Internal Medicine

## 2019-09-21 ENCOUNTER — Other Ambulatory Visit: Payer: Self-pay

## 2019-09-21 DIAGNOSIS — R262 Difficulty in walking, not elsewhere classified: Secondary | ICD-10-CM | POA: Diagnosis not present

## 2019-09-21 DIAGNOSIS — R2681 Unsteadiness on feet: Secondary | ICD-10-CM | POA: Diagnosis not present

## 2019-09-21 DIAGNOSIS — M6281 Muscle weakness (generalized): Secondary | ICD-10-CM | POA: Diagnosis not present

## 2019-09-21 NOTE — Therapy (Signed)
[facility] [address]  [place], [place], [address] Phone: [phone]   Fax:  [phone]  Physical Therapy Treatment  Patient Details  Name: Kaitlyn Ashley MRN: [id number] Date of Birth: 1970/09/18 Referring Provider (PT): Benito Mccreedy, MD   Encounter Date: 09/21/2019   PT End of Session - 09/21/19 0807    Visit Number 11    Number of Visits 22    Date for PT Re-Evaluation 10/26/19    Authorization Type BCBS    PT Start Time 0800    PT Stop Time 0838    PT Time Calculation (min) 38 min    Activity Tolerance Patient tolerated treatment well    Behavior During Therapy [facility] for tasks assessed/performed           Past Medical History:  Diagnosis Date  . Anemia   . Hypertension   . Migraines   . Morbid obesity with BMI of 40.0-44.9, adult ([place])   . Thalassemia     Past Surgical History:  Procedure Laterality Date  . ABDOMINAL HYSTERECTOMY    . BREAST REDUCTION SURGERY    . CESAREAN SECTION      There were no vitals filed for this visit.   Subjective Assessment - 09/21/19 0805    Subjective Pt. noting she feel in her bedroom yesterday and landed on knees after "leg buckeled" without trigger.  Pt. woke up this morning denying soreness with exception of L pinky toe ache pain.    Pertinent History thalassemia, migraines, HTN, anemia    Diagnostic tests CT head and MRI brain were normal during hospitalization from 04/19-04/24    Patient Stated Goals "get back to being able to walk right"    Currently in Pain? No/denies    Pain Score 4     Pain Location Leg    Pain Orientation Left    Pain Descriptors / Indicators Throbbing    Pain Type Acute pain    Pain Score 1    Pain Location --   R pinky toe   Pain Orientation Right    Pain Descriptors / Indicators Aching    Pain Type Acute pain                             [facility] Adult PT Treatment/Exercise - 09/21/19 0001       Exercises   Exercises Elbow      Elbow Exercises   Elbow Flexion Left;15 reps;Standing;Theraband;Strengthening    Theraband Level (Elbow Flexion) Level 2 (Red)    Elbow Flexion Limitations band closed low in door    Elbow Extension Left;15 reps;Standing;Theraband;Strengthening    Theraband Level (Elbow Extension) Level 2 (Red)    Elbow Extension Limitations band closed high in door; focusing on L UE Terminal extension + 3" hold      Knee/Hip Exercises: Aerobic   Nustep L5 x 6 min (UE/LEs)      Knee/Hip Exercises: Supine   Bridges with Clamshell Both;10 reps;Strengthening   + hip abd/ER into red TB at knees      Knee/Hip Exercises: Sidelying   Clams L clam shell x 15 reps       Hand Exercises   Digiticizer yellow digitizer x 10 reps     Other Hand Exercises L Stovall F990 Max grip 15# 2 x 15  PT Short Term Goals - 09/14/19 1319      PT SHORT TERM GOAL #1   Title Patient to be independent with initial HEP.    Time 3    Period Weeks    Status Achieved    Target Date 08/25/19             PT Long Term Goals - 09/14/19 1319      PT LONG TERM GOAL #1   Title Patient to be independent with advanced HEP.    Time 6    Period Weeks    Status Partially Met   met for current   Target Date 10/26/19      PT LONG TERM GOAL #2   Title Patient to score >22/30 on FGA in order to decrease risk of falls.    Time 6    Period Weeks    Status Achieved      PT LONG TERM GOAL #3   Title Patient to demonstrate B UE strength >/=4+/5 and grip strength >40 lbs.    Time 6    Period Weeks    Status On-going   improving in L shoulder flexion and abduction, L elbow and wrist flexion but decrease in L hand grip strength   Target Date 10/26/19      PT LONG TERM GOAL #4   Title Patient to demonstrate B LE strength >/=4+/5.    Time 6    Period Weeks    Status Partially Met   improvement in B knee flexion and L ankle plantarflexion and dorsiflexion strength    Target Date 10/26/19      PT LONG TERM GOAL #5   Title Patient to score <12 sec on 5xSTS without UEs in order to decrease risk of falls.    Time 6    Period Weeks    Status Achieved   08/25/19 5xSTS in 9.89 sec without UEs     PT LONG TERM GOAL #6   Title Patient to report tolerance of 60 minutes of walking/standing without fatigue limiting.    Time 6    Period Weeks    Status On-going   reports 10-15 min   Target Date 10/26/19                 Plan - 09/21/19 0819    Clinical Impression Statement Shareese reporting she stubbed her R pinky toe and had a fall yesterday in her bedroom on carpet landing on her knees.  Pt. unsure of trigger for fall, "it just happened so fast".  Does still endorse L LE/UE weakness with L grip weakness and continued tendency to drop objects frequently.  Session focused on L UE strengthening to patient tolerance with initiation of L biceps/triceps strengthening with red TB.  Patient only complaint during session was 3/10 "throbbing" pain in L LE/foot which intensified with prolonged standing.  Pt. was seen standing with excessive R LE weight shift in session requiring cueing for even weight distribution.  Ended session without complaint and denies issues with latest HEP update.    Comorbidities thalassemia, migraines, HTN, anemia    Rehab Potential Good    PT Frequency 2x / week    PT Treatment/Interventions ADLs/Self Care Home Management;Cryotherapy;Electrical Stimulation;Moist Heat;Balance training;Therapeutic exercise;Therapeutic activities;Functional mobility training;Stair training;Gait training;Ultrasound;Neuromuscular re-education;Patient/family education;Manual techniques;Vasopneumatic Device;Taping;Energy conservation;Dry needling;Passive range of motion    PT Next Visit Plan progress distal to proximal LE and UE strength    Consulted and Agree with Plan of Care Patient  Patient will benefit from skilled therapeutic intervention in order  to improve the following deficits and impairments:  Decreased endurance, Cardiopulmonary status limiting activity, Decreased activity tolerance, Decreased strength, Impaired UE functional use, Pain, Decreased balance, Difficulty walking, Improper body mechanics, Postural dysfunction, Impaired flexibility  Visit Diagnosis: Muscle weakness (generalized)  Difficulty in walking, not elsewhere classified  Unsteadiness on feet     Problem List Patient Active Problem List   Diagnosis Date Noted  . Thalassemia   . Hypertension   . Morbid obesity with BMI of 40.0-44.9, adult [facility])     Bess Harvest, PTA 09/21/19 9:17 AM   [facility] [address]  [place], [place], [address] Phone: [phone]   Fax:  [phone]  Name: MELANEE CORDIAL MRN: [id number] Date of Birth: 1970-06-03

## 2019-09-24 ENCOUNTER — Other Ambulatory Visit: Payer: Self-pay

## 2019-09-24 ENCOUNTER — Ambulatory Visit: Payer: BC Managed Care – PPO

## 2019-09-24 DIAGNOSIS — R262 Difficulty in walking, not elsewhere classified: Secondary | ICD-10-CM | POA: Diagnosis not present

## 2019-09-24 DIAGNOSIS — M6281 Muscle weakness (generalized): Secondary | ICD-10-CM

## 2019-09-24 DIAGNOSIS — R2681 Unsteadiness on feet: Secondary | ICD-10-CM

## 2019-09-24 NOTE — Therapy (Signed)
[facility] [address]  [place], [place], [address] Phone: [phone]   Fax:  [phone]  Physical Therapy Treatment  Patient Details  Name: Kaitlyn Ashley MRN: [id number] Date of Birth: May 03, 1970 Referring Provider (PT): Benito Mccreedy, MD   Encounter Date: 09/24/2019   PT End of Session - 09/24/19 0807    Visit Number 12    Number of Visits 22    Date for PT Re-Evaluation 10/26/19    Authorization Type BCBS    PT Start Time 0800    PT Stop Time 0841    PT Time Calculation (min) 41 min    Activity Tolerance Patient tolerated treatment well    Behavior During Therapy [facility] for tasks assessed/performed           Past Medical History:  Diagnosis Date  . Anemia   . Hypertension   . Migraines   . Morbid obesity with BMI of 40.0-44.9, adult ([place])   . Thalassemia     Past Surgical History:  Procedure Laterality Date  . ABDOMINAL HYSTERECTOMY    . BREAST REDUCTION SURGERY    . CESAREAN SECTION      There were no vitals filed for this visit.   Subjective Assessment - 09/24/19 0805    Subjective Pt. noting L hand "locking" yesterday which she attributes to a cramp.    Pertinent History thalassemia, migraines, HTN, anemia    Diagnostic tests CT head and MRI brain were normal during hospitalization from 04/19-04/24    Patient Stated Goals "get back to being able to walk right"    Currently in Pain? No/denies    Pain Score 0-No pain    Pain Location Leg    Pain Orientation Left    Multiple Pain Sites No                             [facility] Adult PT Treatment/Exercise - 09/24/19 0001      Self-Care   Self-Care Other Self-Care Comments    Other Self-Care Comments  Discussion of GBS and typical progression of strength return; answered patient questions and provided some encouragement to continue performing strengthening HEP at home       Knee/Hip Exercises: Aerobic   Nustep L5 x 6  min (UE/LEs)      Knee/Hip Exercises: Seated   Long Arc Quad Left;10 reps;Strengthening    Long Arc Quad Limitations cues for TKE      Shoulder Exercises: Seated   Flexion 10 reps;Both;Strengthening;Weights    Flexion Weight (lbs) 1    Flexion Limitations sitting     Abduction 10 reps;Both;Strengthening;Weights    ABduction Weight (lbs) 1    ABduction Limitations sitting       Shoulder Exercises: Standing   ABduction Both;10 reps;Strengthening;Weights    Shoulder ABduction Weight (lbs) 1    ABduction Limitations scaption     Extension Both;10 reps;Strengthening    Theraband Level (Shoulder Extension) Level 2 (Red)    Extension Limitations Cues for scapular retraction     Row Both;10 reps;Strengthening;Theraband    Theraband Level (Shoulder Row) Level 3 (Green)    Row Limitations cues for scapular retraction       Hand Exercises   Digiticizer yellow digitizer 2 x 15 reps                     PT Short Term Goals - 09/14/19 1319  PT SHORT TERM GOAL #1   Title Patient to be independent with initial HEP.    Time 3    Period Weeks    Status Achieved    Target Date 08/25/19             PT Long Term Goals - 09/14/19 1319      PT LONG TERM GOAL #1   Title Patient to be independent with advanced HEP.    Time 6    Period Weeks    Status Partially Met   met for current   Target Date 10/26/19      PT LONG TERM GOAL #2   Title Patient to score >22/30 on FGA in order to decrease risk of falls.    Time 6    Period Weeks    Status Achieved      PT LONG TERM GOAL #3   Title Patient to demonstrate B UE strength >/=4+/5 and grip strength >40 lbs.    Time 6    Period Weeks    Status On-going   improving in L shoulder flexion and abduction, L elbow and wrist flexion but decrease in L hand grip strength   Target Date 10/26/19      PT LONG TERM GOAL #4   Title Patient to demonstrate B LE strength >/=4+/5.    Time 6    Period Weeks    Status Partially Met    improvement in B knee flexion and L ankle plantarflexion and dorsiflexion strength   Target Date 10/26/19      PT LONG TERM GOAL #5   Title Patient to score <12 sec on 5xSTS without UEs in order to decrease risk of falls.    Time 6    Period Weeks    Status Achieved   08/25/19 5xSTS in 9.89 sec without UEs     PT LONG TERM GOAL #6   Title Patient to report tolerance of 60 minutes of walking/standing without fatigue limiting.    Time 6    Period Weeks    Status On-going   reports 10-15 min   Target Date 10/26/19                 Plan - 09/24/19 7282    Clinical Impression Statement Kaitlyn Ashley doing ok today.  Does note "locking" of L hand (attributes to cramp) yesterday x 3 which has not happened again today.  Progressed UE/grip strengthening activities today without issue.  Did not B quad instability following NuStep warmup and LAQ strengthening activity today which subsided with rest.  Pt. noting B quad/knee instability after shopping in [place] in past and notes this subsides with sitting rest.  Did encourage pt. to continue strengthening activities as home with HEP as pt. expressing some frustration note seeing full return since GBS onset.  Pt. leaving session verbalizing motivation to stay dedicated to HEP and continue walking as she is able.    Comorbidities thalassemia, migraines, HTN, anemia    Rehab Potential Good    PT Frequency 2x / week    PT Treatment/Interventions ADLs/Self Care Home Management;Cryotherapy;Electrical Stimulation;Moist Heat;Balance training;Therapeutic exercise;Therapeutic activities;Functional mobility training;Stair training;Gait training;Ultrasound;Neuromuscular re-education;Patient/family education;Manual techniques;Vasopneumatic Device;Taping;Energy conservation;Dry needling;Passive range of motion    PT Next Visit Plan progress distal to proximal LE and UE strength    Consulted and Agree with Plan of Care Patient           Patient will benefit from  skilled therapeutic intervention in order to improve the  following deficits and impairments:  Decreased endurance, Cardiopulmonary status limiting activity, Decreased activity tolerance, Decreased strength, Impaired UE functional use, Pain, Decreased balance, Difficulty walking, Improper body mechanics, Postural dysfunction, Impaired flexibility  Visit Diagnosis: Muscle weakness (generalized)  Difficulty in walking, not elsewhere classified  Unsteadiness on feet     Problem List Patient Active Problem List   Diagnosis Date Noted  . Thalassemia   . Hypertension   . Morbid obesity with BMI of 40.0-44.9, adult [facility])     Bess Harvest, PTA 09/24/19 2:37 PM   [facility] [address]  [place], [place], [address] Phone: [phone]   Fax:  [phone]  Name: Kaitlyn Ashley MRN: [id number] Date of Birth: 08/29/1970

## 2019-09-28 ENCOUNTER — Other Ambulatory Visit: Payer: Self-pay

## 2019-09-28 ENCOUNTER — Ambulatory Visit: Payer: BC Managed Care – PPO | Admitting: Physical Therapy

## 2019-09-28 ENCOUNTER — Encounter: Payer: Self-pay | Admitting: Physical Therapy

## 2019-09-28 DIAGNOSIS — Z03818 Encounter for observation for suspected exposure to other biological agents ruled out: Secondary | ICD-10-CM | POA: Diagnosis not present

## 2019-09-28 DIAGNOSIS — R262 Difficulty in walking, not elsewhere classified: Secondary | ICD-10-CM | POA: Diagnosis not present

## 2019-09-28 DIAGNOSIS — Z20822 Contact with and (suspected) exposure to covid-19: Secondary | ICD-10-CM | POA: Diagnosis not present

## 2019-09-28 DIAGNOSIS — M6281 Muscle weakness (generalized): Secondary | ICD-10-CM

## 2019-09-28 DIAGNOSIS — R2681 Unsteadiness on feet: Secondary | ICD-10-CM | POA: Diagnosis not present

## 2019-09-28 NOTE — Therapy (Signed)
[facility] [address]  [place], [place], [address] Phone: [phone]   Fax:  [phone]  Physical Therapy Treatment  Patient Details  Name: Kaitlyn Ashley MRN: [id number] Date of Birth: 1970/03/25 Referring Provider (PT): Benito Mccreedy, MD   Encounter Date: 09/28/2019   PT End of Session - 09/28/19 0843    Visit Number 13    Number of Visits 22    Date for PT Re-Evaluation 10/26/19    Authorization Type BCBS    PT Start Time 0758    PT Stop Time 0839    PT Time Calculation (min) 41 min    Activity Tolerance Patient tolerated treatment well    Behavior During Therapy [facility] for tasks assessed/performed           Past Medical History:  Diagnosis Date  . Anemia   . Hypertension   . Migraines   . Morbid obesity with BMI of 40.0-44.9, adult ([place])   . Thalassemia     Past Surgical History:  Procedure Laterality Date  . ABDOMINAL HYSTERECTOMY    . BREAST REDUCTION SURGERY    . CESAREAN SECTION      There were no vitals filed for this visit.   Subjective Assessment - 09/28/19 0759    Subjective "Same old, same old." States that work may decrease her hours since she is having trouble completing work tasks d/t fatigue. Pinky toe is no longer bothering her. No falls since last sesison.    Pertinent History thalassemia, migraines, HTN, anemia    Diagnostic tests CT head and MRI brain were normal during hospitalization from 04/19-04/24    Patient Stated Goals "get back to being able to walk right"    Currently in Pain? No/denies                             [facility] Adult PT Treatment/Exercise - 09/28/19 0001      Neuro Re-ed    Neuro Re-ed Details  marching on foam 2x20 with CGA and 2 ski poles as needed      Knee/Hip Exercises: Stretches   Active Hamstring Stretch Right;Left;1 rep;30 seconds    Active Hamstring Stretch Limitations sitting with DF    Piriformis Stretch Right;Left;1  rep;30 seconds    Piriformis Stretch Limitations sitting fig 4      Knee/Hip Exercises: Aerobic   Nustep L5 x 6 min (UE/LEs)      Knee/Hip Exercises: Machines for Strengthening   Cybex Knee Extension L LE 10# 2x10   good speed   Cybex Knee Flexion L LE 10# 2x10   good speed and control     Knee/Hip Exercises: Seated   Sit to Sand 2 sets;without UE support;10 reps   holding orange medball at chest, barely touching bottom to m     Shoulder Exercises: Seated   Internal Rotation Strengthening;Left;10 reps;Theraband    Theraband Level (Shoulder Internal Rotation) Level 2 (Red);Level 3 (Green)    Internal Rotation Limitations cues to stop at neutral     Flexion Strengthening;Both;5 reps;Weights    Flexion Weight (lbs) 2    Flexion Limitations limited ROM    Other Seated Exercises B scaption with 2# 2x5                  PT Education - 09/28/19 0842    Education Details verbal review of HEP    Person(s) Educated Patient  Methods Explanation    Comprehension Verbalized understanding            PT Short Term Goals - 09/14/19 1319      PT SHORT TERM GOAL #1   Title Patient to be independent with initial HEP.    Time 3    Period Weeks    Status Achieved    Target Date 08/25/19             PT Long Term Goals - 09/14/19 1319      PT LONG TERM GOAL #1   Title Patient to be independent with advanced HEP.    Time 6    Period Weeks    Status Partially Met   met for current   Target Date 10/26/19      PT LONG TERM GOAL #2   Title Patient to score >22/30 on FGA in order to decrease risk of falls.    Time 6    Period Weeks    Status Achieved      PT LONG TERM GOAL #3   Title Patient to demonstrate B UE strength >/=4+/5 and grip strength >40 lbs.    Time 6    Period Weeks    Status On-going   improving in L shoulder flexion and abduction, L elbow and wrist flexion but decrease in L hand grip strength   Target Date 10/26/19      PT LONG TERM GOAL #4   Title  Patient to demonstrate B LE strength >/=4+/5.    Time 6    Period Weeks    Status Partially Met   improvement in B knee flexion and L ankle plantarflexion and dorsiflexion strength   Target Date 10/26/19      PT LONG TERM GOAL #5   Title Patient to score <12 sec on 5xSTS without UEs in order to decrease risk of falls.    Time 6    Period Weeks    Status Achieved   08/25/19 5xSTS in 9.89 sec without UEs     PT LONG TERM GOAL #6   Title Patient to report tolerance of 60 minutes of walking/standing without fatigue limiting.    Time 6    Period Weeks    Status On-going   reports 10-15 min   Target Date 10/26/19                 Plan - 09/28/19 0847    Clinical Impression Statement Patient without new complaints and denies falls since last session. Worked on progressive shoulder strengthening with increased weighted resistance. Patient demonstrated decreased ROM d/t weakness with shoulder flexion and scaption. Tolerated green TB with shoulder IR with good effort and control. Verbally reviewed HEP with patient reporting good carryover and ability to self-progress ther-ex as appropriate. Patient demonstrated good speed and control with LE machine strengthening, but unable to increased weighted resistance d/t challenge. Reported mild fatigue with weighted squats but overall with very good improvement in tolerance for this exercise. Ended session without complaints. Patient is progressing well towards goals.    Comorbidities thalassemia, migraines, HTN, anemia    Rehab Potential Good    PT Frequency 2x / week    PT Treatment/Interventions ADLs/Self Care Home Management;Cryotherapy;Electrical Stimulation;Moist Heat;Balance training;Therapeutic exercise;Therapeutic activities;Functional mobility training;Stair training;Gait training;Ultrasound;Neuromuscular re-education;Patient/family education;Manual techniques;Vasopneumatic Device;Taping;Energy conservation;Dry needling;Passive range of motion     PT Next Visit Plan progress distal to proximal LE and UE strength    Consulted and Agree with Plan of Care Patient  Patient will benefit from skilled therapeutic intervention in order to improve the following deficits and impairments:  Decreased endurance, Cardiopulmonary status limiting activity, Decreased activity tolerance, Decreased strength, Impaired UE functional use, Pain, Decreased balance, Difficulty walking, Improper body mechanics, Postural dysfunction, Impaired flexibility  Visit Diagnosis: Muscle weakness (generalized)  Difficulty in walking, not elsewhere classified  Unsteadiness on feet     Problem List Patient Active Problem List   Diagnosis Date Noted  . Thalassemia   . Hypertension   . Morbid obesity with BMI of 40.0-44.9, adult [facility])      Janene Harvey, PT, DPT 09/28/19 8:49 AM   [facility] [address]  [place], [place], [address] Phone: [phone]   Fax:  [phone]  Name: Kaitlyn Ashley MRN: [id number] Date of Birth: 07/02/1970

## 2019-10-01 ENCOUNTER — Ambulatory Visit: Payer: BC Managed Care – PPO

## 2019-10-05 ENCOUNTER — Ambulatory Visit: Payer: BC Managed Care – PPO | Admitting: Physical Therapy

## 2019-10-05 ENCOUNTER — Other Ambulatory Visit: Payer: Self-pay

## 2019-10-05 ENCOUNTER — Encounter: Payer: Self-pay | Admitting: Physical Therapy

## 2019-10-05 DIAGNOSIS — R2681 Unsteadiness on feet: Secondary | ICD-10-CM | POA: Diagnosis not present

## 2019-10-05 DIAGNOSIS — R262 Difficulty in walking, not elsewhere classified: Secondary | ICD-10-CM

## 2019-10-05 DIAGNOSIS — M6281 Muscle weakness (generalized): Secondary | ICD-10-CM | POA: Diagnosis not present

## 2019-10-05 NOTE — Therapy (Signed)
[facility] [address]  [place], [place], [address] Phone: [phone]   Fax:  [phone]  Physical Therapy Treatment  Patient Details  Name: Kaitlyn Ashley MRN: [id number] Date of Birth: 1970-09-16 Referring Provider (PT): Benito Mccreedy, MD   Encounter Date: 10/05/2019   PT End of Session - 10/05/19 0841    Visit Number 14    Number of Visits 22    Date for PT Re-Evaluation 10/26/19    Authorization Type BCBS    PT Start Time 0800    PT Stop Time 0841    PT Time Calculation (min) 41 min    Activity Tolerance Patient tolerated treatment well    Behavior During Therapy [facility] for tasks assessed/performed           Past Medical History:  Diagnosis Date  . Anemia   . Hypertension   . Migraines   . Morbid obesity with BMI of 40.0-44.9, adult ([place])   . Thalassemia     Past Surgical History:  Procedure Laterality Date  . ABDOMINAL HYSTERECTOMY    . BREAST REDUCTION SURGERY    . CESAREAN SECTION      There were no vitals filed for this visit.   Subjective Assessment - 10/05/19 0801    Subjective Husband and her both had a stomach virus last week. Feeling better now. Scheduled for a neurology appointment on 11/18/19.    Pertinent History thalassemia, migraines, HTN, anemia    Diagnostic tests CT head and MRI brain were normal during hospitalization from 04/19-04/24    Patient Stated Goals "get back to being able to walk right"    Currently in Pain? No/denies                             [facility] Adult PT Treatment/Exercise - 10/05/19 0001      Elbow Exercises   Elbow Flexion Left;15 reps;Theraband;Strengthening;Seated    Theraband Level (Elbow Flexion) Level 2 (Red);Level 3 (Green)    Elbow Flexion Limitations sitting stepping on band; 2nd set green TB    Elbow Extension Left;15 reps;Standing;Theraband;Strengthening    Theraband Level (Elbow Extension) Level 2 (Red)    Elbow  Extension Limitations sitting, stabilizing other end at chest; 2nd set green TB      Knee/Hip Exercises: Aerobic   Nustep L5 x 6 min (UE/LEs)      Knee/Hip Exercises: Machines for Strengthening   Cybex Knee Extension L LE 13# 2x10   c/o challenge   Cybex Knee Flexion L LE 13# 2x10   cues to extend knee further     Knee/Hip Exercises: Standing   Terminal Knee Extension Strengthening;Left;1 set;10 reps;Theraband    Theraband Level (Terminal Knee Extension) Level 3 (Green)    Terminal Knee Extension Limitations L TKE + mini squat holding onto chair   cueing to shift weight back onto heels     Ankle Exercises: Seated   Other Seated Ankle Exercises L 4 way ankle with green TB x15 each      Ankle Exercises: Stretches   Gastroc Stretch Limitations L and R sitting gastroc stretch with strap 2x30"                     PT Short Term Goals - 09/14/19 1319      PT SHORT TERM GOAL #1   Title Patient to be independent with initial HEP.    Time  3    Period Weeks    Status Achieved    Target Date 08/25/19             PT Long Term Goals - 09/14/19 1319      PT LONG TERM GOAL #1   Title Patient to be independent with advanced HEP.    Time 6    Period Weeks    Status Partially Met   met for current   Target Date 10/26/19      PT LONG TERM GOAL #2   Title Patient to score >22/30 on FGA in order to decrease risk of falls.    Time 6    Period Weeks    Status Achieved      PT LONG TERM GOAL #3   Title Patient to demonstrate B UE strength >/=4+/5 and grip strength >40 lbs.    Time 6    Period Weeks    Status On-going   improving in L shoulder flexion and abduction, L elbow and wrist flexion but decrease in L hand grip strength   Target Date 10/26/19      PT LONG TERM GOAL #4   Title Patient to demonstrate B LE strength >/=4+/5.    Time 6    Period Weeks    Status Partially Met   improvement in B knee flexion and L ankle plantarflexion and dorsiflexion strength   Target  Date 10/26/19      PT LONG TERM GOAL #5   Title Patient to score <12 sec on 5xSTS without UEs in order to decrease risk of falls.    Time 6    Period Weeks    Status Achieved   08/25/19 5xSTS in 9.89 sec without UEs     PT LONG TERM GOAL #6   Title Patient to report tolerance of 60 minutes of walking/standing without fatigue limiting.    Time 6    Period Weeks    Status On-going   reports 10-15 min   Target Date 10/26/19                 Plan - 10/05/19 0841    Clinical Impression Statement Patient reporting that she had a stomach virus last week that she has since recovered from. Scheduled for a neurology appointment on 11/18/19. Still demonstrating considerable knee instability and slight buckling after warmup- slightly increased today however patient denying increased weakness today. Performed resisted elbow strengthening with patient demonstrating good tolerance and control and able to tolerate increased resistance today. Also able to perform resisted dorsiflexion strengthening with increased banded resistance today. Patient was able to tolerate a small increase in weighted resistance with leg extensions and hamstring curls using machine with report of "challenge." Required cueing to shift weight back onto heels with squatting today. Overall patient with good tolerance for increased challenge with ther-ex today. No complaints at end of session.    Comorbidities thalassemia, migraines, HTN, anemia    Rehab Potential Good    PT Frequency 2x / week    PT Treatment/Interventions ADLs/Self Care Home Management;Cryotherapy;Electrical Stimulation;Moist Heat;Balance training;Therapeutic exercise;Therapeutic activities;Functional mobility training;Stair training;Gait training;Ultrasound;Neuromuscular re-education;Patient/family education;Manual techniques;Vasopneumatic Device;Taping;Energy conservation;Dry needling;Passive range of motion    PT Next Visit Plan progress distal to proximal LE  and UE strength    Consulted and Agree with Plan of Care Patient           Patient will benefit from skilled therapeutic intervention in order to improve the following deficits and impairments:  Decreased  endurance, Cardiopulmonary status limiting activity, Decreased activity tolerance, Decreased strength, Impaired UE functional use, Pain, Decreased balance, Difficulty walking, Improper body mechanics, Postural dysfunction, Impaired flexibility  Visit Diagnosis: Muscle weakness (generalized)  Difficulty in walking, not elsewhere classified  Unsteadiness on feet     Problem List Patient Active Problem List   Diagnosis Date Noted  . Thalassemia   . Hypertension   . Morbid obesity with BMI of 40.0-44.9, adult [facility])      Janene Harvey, PT, DPT 10/05/19 8:43 AM   [facility] [address]  [address] [place], [place], [address] Phone: [phone]   Fax:  [phone]  Name: Kaitlyn Ashley MRN: [id number] Date of Birth: [birth year]

## 2019-10-08 ENCOUNTER — Ambulatory Visit: Payer: BC Managed Care – PPO

## 2019-10-09 DIAGNOSIS — I10 Essential (primary) hypertension: Secondary | ICD-10-CM | POA: Diagnosis not present

## 2019-10-09 DIAGNOSIS — R600 Localized edema: Secondary | ICD-10-CM | POA: Diagnosis not present

## 2019-10-09 DIAGNOSIS — G43909 Migraine, unspecified, not intractable, without status migrainosus: Secondary | ICD-10-CM | POA: Diagnosis not present

## 2019-10-09 DIAGNOSIS — F419 Anxiety disorder, unspecified: Secondary | ICD-10-CM | POA: Diagnosis not present

## 2019-10-13 ENCOUNTER — Ambulatory Visit: Payer: BC Managed Care – PPO

## 2019-10-13 ENCOUNTER — Other Ambulatory Visit: Payer: Self-pay

## 2019-10-13 DIAGNOSIS — R2681 Unsteadiness on feet: Secondary | ICD-10-CM | POA: Diagnosis not present

## 2019-10-13 DIAGNOSIS — R262 Difficulty in walking, not elsewhere classified: Secondary | ICD-10-CM

## 2019-10-13 DIAGNOSIS — M6281 Muscle weakness (generalized): Secondary | ICD-10-CM | POA: Diagnosis not present

## 2019-10-13 NOTE — Therapy (Signed)
[facility] [address]  [place], [place], [address] Phone: [phone]   Fax:  [phone]  Physical Therapy Treatment  Patient Details  Name: Kaitlyn Ashley MRN: [id number] Date of Birth: 1970/12/08 Referring Provider (PT): Benito Mccreedy, MD   Encounter Date: 10/13/2019   PT End of Session - 10/13/19 0807    Visit Number 15    Number of Visits 22    Date for PT Re-Evaluation 10/26/19    Authorization Type BCBS    PT Start Time 0803    PT Stop Time 0845    PT Time Calculation (min) 42 min    Activity Tolerance Patient tolerated treatment well    Behavior During Therapy [facility] for tasks assessed/performed           Past Medical History:  Diagnosis Date  . Anemia   . Hypertension   . Migraines   . Morbid obesity with BMI of 40.0-44.9, adult ([place])   . Thalassemia     Past Surgical History:  Procedure Laterality Date  . ABDOMINAL HYSTERECTOMY    . BREAST REDUCTION SURGERY    . CESAREAN SECTION      There were no vitals filed for this visit.   Subjective Assessment - 10/13/19 0806    Subjective Doing ok.    Pertinent History thalassemia, migraines, HTN, anemia    How long can you walk comfortably? 15 min    Diagnostic tests CT head and MRI brain were normal during hospitalization from 04/19-04/24    Patient Stated Goals "get back to being able to walk right"    Currently in Pain? No/denies    Pain Score 0-No pain    Multiple Pain Sites No                             [facility] Adult PT Treatment/Exercise - 10/13/19 0001      Elbow Exercises   Elbow Extension Left;10 reps;Theraband;Strengthening    Theraband Level (Elbow Extension) Level 3 (Green)    Elbow Extension Limitations 2 sets; green TB in door      Knee/Hip Exercises: Aerobic   Recumbent Bike Lvl 1, 6 min       Knee/Hip Exercises: Standing   Heel Raises Both;20 reps    Heel Raises Limitations heel/ toe raise        Knee/Hip Exercises: Supine   Bridges Both;Strengthening    Bridges Limitations x 12, 3 sec hold       Shoulder Exercises: Seated   Flexion Both;12 reps;Weights;Strengthening    Flexion Weight (lbs) 2     Flexion Limitations 0-130 dg     Abduction Both;10 reps;Strengthening;Weights    ABduction Weight (lbs) 2      Shoulder Exercises: Standing   Other Standing Exercises Wall pushups focusing on hold 3" and full terminal elbow extension x 15 reps      Shoulder Exercises: ROM/Strengthening   UBE (Upper Arm Bike) Lvl 3.0, 1 min forwards/1 min backwards       Hand Exercises   Other Hand Exercises L Stovall F990 Max grip 20# 2 x 15       Wrist Exercises   Wrist Extension Left;Strengthening;Seated;Bar weights/barbell;15 reps   2 sets    Bar Weights/Barbell (Wrist Extension) 2 lbs                    PT Short Term Goals - 09/14/19 1319  PT SHORT TERM GOAL #1   Title Patient to be independent with initial HEP.    Time 3    Period Weeks    Status Achieved    Target Date 08/25/19             PT Long Term Goals - 09/14/19 1319      PT LONG TERM GOAL #1   Title Patient to be independent with advanced HEP.    Time 6    Period Weeks    Status Partially Met   met for current   Target Date 10/26/19      PT LONG TERM GOAL #2   Title Patient to score >22/30 on FGA in order to decrease risk of falls.    Time 6    Period Weeks    Status Achieved      PT LONG TERM GOAL #3   Title Patient to demonstrate B UE strength >/=4+/5 and grip strength >40 lbs.    Time 6    Period Weeks    Status On-going   improving in L shoulder flexion and abduction, L elbow and wrist flexion but decrease in L hand grip strength   Target Date 10/26/19      PT LONG TERM GOAL #4   Title Patient to demonstrate B LE strength >/=4+/5.    Time 6    Period Weeks    Status Partially Met   improvement in B knee flexion and L ankle plantarflexion and dorsiflexion strength   Target Date  10/26/19      PT LONG TERM GOAL #5   Title Patient to score <12 sec on 5xSTS without UEs in order to decrease risk of falls.    Time 6    Period Weeks    Status Achieved   08/25/19 5xSTS in 9.89 sec without UEs     PT LONG TERM GOAL #6   Title Patient to report tolerance of 60 minutes of walking/standing without fatigue limiting.    Time 6    Period Weeks    Status On-going   reports 10-15 min   Target Date 10/26/19                 Plan - 10/13/19 0808    Clinical Impression Statement [place] seen today with complaint of difficulty waking up in mornings into time for work and PT.  Was written out of work by MD for ~ 1 month.  Pt. with visible B LE wavering/weakness requiring supervision from therapist for safety following gentle recumbent bike warmup.  Periodic wavering LE weakness noted following LE strengthening activities throughout session requiring sitting rest break at times for safety.  Progressed L shoulder and elbow strengthening to addressed targeted strength deficits.  Pt. denies soreness after sessions only fatigue which lasts remainder of day.  Ended session pain free.    Comorbidities thalassemia, migraines, HTN, anemia    Rehab Potential Good    PT Frequency 2x / week    PT Treatment/Interventions ADLs/Self Care Home Management;Cryotherapy;Electrical Stimulation;Moist Heat;Balance training;Therapeutic exercise;Therapeutic activities;Functional mobility training;Stair training;Gait training;Ultrasound;Neuromuscular re-education;Patient/family education;Manual techniques;Vasopneumatic Device;Taping;Energy conservation;Dry needling;Passive range of motion    PT Next Visit Plan progress distal to proximal LE and UE strength    Consulted and Agree with Plan of Care Patient           Patient will benefit from skilled therapeutic intervention in order to improve the following deficits and impairments:  Decreased endurance, Cardiopulmonary status limiting activity,  Decreased activity  tolerance, Decreased strength, Impaired UE functional use, Pain, Decreased balance, Difficulty walking, Improper body mechanics, Postural dysfunction, Impaired flexibility  Visit Diagnosis: Muscle weakness (generalized)  Difficulty in walking, not elsewhere classified  Unsteadiness on feet     Problem List Patient Active Problem List   Diagnosis Date Noted  . Thalassemia   . Hypertension   . Morbid obesity with BMI of 40.0-44.9, adult [facility])     Bess Harvest, PTA 10/13/19 10:34 AM   [facility] [address]  [place], [place], [address] Phone: [phone]   Fax:  [phone]  Name: Kaitlyn Ashley MRN: [id number] Date of Birth: 07/13/1970

## 2019-10-15 ENCOUNTER — Ambulatory Visit: Payer: BC Managed Care – PPO

## 2019-10-15 ENCOUNTER — Other Ambulatory Visit: Payer: Self-pay

## 2019-10-15 DIAGNOSIS — R262 Difficulty in walking, not elsewhere classified: Secondary | ICD-10-CM | POA: Diagnosis not present

## 2019-10-15 DIAGNOSIS — M6281 Muscle weakness (generalized): Secondary | ICD-10-CM

## 2019-10-15 DIAGNOSIS — R2681 Unsteadiness on feet: Secondary | ICD-10-CM | POA: Diagnosis not present

## 2019-10-15 NOTE — Therapy (Signed)
[facility] [address]  [place], [place], [address] Phone: [phone]   Fax:  [phone]  Physical Therapy Treatment  Patient Details  Name: Kaitlyn Ashley MRN: [id number] Date of Birth: 12-Apr-1970 Referring Provider (PT): Benito Mccreedy, MD   Encounter Date: 10/15/2019   PT End of Session - 10/15/19 0807    Visit Number 16    Number of Visits 22    Date for PT Re-Evaluation 10/26/19    Authorization Type BCBS    PT Start Time 0800    PT Stop Time 0841    PT Time Calculation (min) 41 min    Activity Tolerance Patient tolerated treatment well    Behavior During Therapy [facility] for tasks assessed/performed           Past Medical History:  Diagnosis Date  . Anemia   . Hypertension   . Migraines   . Morbid obesity with BMI of 40.0-44.9, adult ([place])   . Thalassemia     Past Surgical History:  Procedure Laterality Date  . ABDOMINAL HYSTERECTOMY    . BREAST REDUCTION SURGERY    . CESAREAN SECTION      There were no vitals filed for this visit.   Subjective Assessment - 10/15/19 0803    Subjective Pt. noting MD put her on potassium supplement.    Pertinent History thalassemia, migraines, HTN, anemia    Diagnostic tests CT head and MRI brain were normal during hospitalization from 04/19-04/24    Patient Stated Goals "get back to being able to walk right"    Currently in Pain? No/denies    Pain Score 0-No pain    Multiple Pain Sites No                             [facility] Adult PT Treatment/Exercise - 10/15/19 0001      Elbow Exercises   Elbow Flexion Left;15 reps;Theraband;Strengthening    Theraband Level (Elbow Flexion) Level 3 (Green)      Knee/Hip Exercises: Aerobic   Recumbent Bike Lvl 1, 6 min       Knee/Hip Exercises: Standing   Hip Abduction 10 reps;Left;Right;Stengthening;Knee straight    Abduction Limitations hands on counter     Hip Extension Right;Left;10 reps;Knee  straight;Stengthening    Extension Limitations hands on counter     Forward Step Up Right;Left;10 reps;Step Height: 6"    Forward Step Up Limitations 7" step in stair well     Functional Squat 1 set;10 reps    Functional Squat Limitations counter squat       Shoulder Exercises: Seated   Flexion Both;15 reps;Weights;Strengthening   2 sets    Flexion Weight (lbs) 2    Flexion Limitations 0-130 dg     Abduction Both;Strengthening;Weights;12 reps   2 sets    ABduction Weight (lbs) 2                    PT Short Term Goals - 09/14/19 1319      PT SHORT TERM GOAL #1   Title Patient to be independent with initial HEP.    Time 3    Period Weeks    Status Achieved    Target Date 08/25/19             PT Long Term Goals - 09/14/19 1319      PT LONG TERM GOAL #1   Title Patient  to be independent with advanced HEP.    Time 6    Period Weeks    Status Partially Met   met for current   Target Date 10/26/19      PT LONG TERM GOAL #2   Title Patient to score >22/30 on FGA in order to decrease risk of falls.    Time 6    Period Weeks    Status Achieved      PT LONG TERM GOAL #3   Title Patient to demonstrate B UE strength >/=4+/5 and grip strength >40 lbs.    Time 6    Period Weeks    Status On-going   improving in L shoulder flexion and abduction, L elbow and wrist flexion but decrease in L hand grip strength   Target Date 10/26/19      PT LONG TERM GOAL #4   Title Patient to demonstrate B LE strength >/=4+/5.    Time 6    Period Weeks    Status Partially Met   improvement in B knee flexion and L ankle plantarflexion and dorsiflexion strength   Target Date 10/26/19      PT LONG TERM GOAL #5   Title Patient to score <12 sec on 5xSTS without UEs in order to decrease risk of falls.    Time 6    Period Weeks    Status Achieved   08/25/19 5xSTS in 9.89 sec without UEs     PT LONG TERM GOAL #6   Title Patient to report tolerance of 60 minutes of walking/standing  without fatigue limiting.    Time 6    Period Weeks    Status On-going   reports 10-15 min   Target Date 10/26/19                 Plan - 10/15/19 0807    Clinical Impression Statement [place] denies soreness after last visit only LE fatigue which subsided in a few hours.  Expressing she is anxious regarding her son's college housing arrangement and his potential exposure to Covid-19 which has caused her to be stressed recently.  Progressed LE/proximal hip strengthening along with progression of repetitions with shoulder elevation strengthening without pain.  Left session with LE fatigue notable with increased LE wavering however able to ambulate safely.    Comorbidities thalassemia, migraines, HTN, anemia    Rehab Potential Good    PT Frequency 2x / week    PT Duration 6 weeks    PT Treatment/Interventions ADLs/Self Care Home Management;Cryotherapy;Electrical Stimulation;Moist Heat;Balance training;Therapeutic exercise;Therapeutic activities;Functional mobility training;Stair training;Gait training;Ultrasound;Neuromuscular re-education;Patient/family education;Manual techniques;Vasopneumatic Device;Taping;Energy conservation;Dry needling;Passive range of motion    PT Next Visit Plan progress distal to proximal LE and UE strength    Consulted and Agree with Plan of Care Patient           Patient will benefit from skilled therapeutic intervention in order to improve the following deficits and impairments:  Decreased endurance, Cardiopulmonary status limiting activity, Decreased activity tolerance, Decreased strength, Impaired UE functional use, Pain, Decreased balance, Difficulty walking, Improper body mechanics, Postural dysfunction, Impaired flexibility  Visit Diagnosis: Muscle weakness (generalized)  Difficulty in walking, not elsewhere classified  Unsteadiness on feet     Problem List Patient Active Problem List   Diagnosis Date Noted  . Thalassemia   . Hypertension   .  Morbid obesity with BMI of 40.0-44.9, adult [facility])     Bess Harvest, PTA 10/15/19 11:38 AM   [facility] [address]  [place], [place], [address] Phone: [phone]   Fax:  [phone]  Name: Kaitlyn Ashley MRN: [id number] Date of Birth: 1970-04-23

## 2019-10-19 ENCOUNTER — Ambulatory Visit: Payer: BC Managed Care – PPO | Admitting: Physical Therapy

## 2019-10-19 DIAGNOSIS — N182 Chronic kidney disease, stage 2 (mild): Secondary | ICD-10-CM | POA: Diagnosis not present

## 2019-10-19 DIAGNOSIS — G43909 Migraine, unspecified, not intractable, without status migrainosus: Secondary | ICD-10-CM | POA: Diagnosis not present

## 2019-10-19 DIAGNOSIS — R600 Localized edema: Secondary | ICD-10-CM | POA: Diagnosis not present

## 2019-10-19 DIAGNOSIS — E876 Hypokalemia: Secondary | ICD-10-CM | POA: Diagnosis not present

## 2019-10-19 DIAGNOSIS — R635 Abnormal weight gain: Secondary | ICD-10-CM | POA: Diagnosis not present

## 2019-10-19 DIAGNOSIS — Z79899 Other long term (current) drug therapy: Secondary | ICD-10-CM | POA: Diagnosis not present

## 2019-10-19 DIAGNOSIS — F419 Anxiety disorder, unspecified: Secondary | ICD-10-CM | POA: Diagnosis not present

## 2019-10-19 DIAGNOSIS — R7303 Prediabetes: Secondary | ICD-10-CM | POA: Diagnosis not present

## 2019-10-19 DIAGNOSIS — I10 Essential (primary) hypertension: Secondary | ICD-10-CM | POA: Diagnosis not present

## 2019-10-22 ENCOUNTER — Ambulatory Visit: Payer: BC Managed Care – PPO

## 2019-11-04 ENCOUNTER — Encounter: Payer: Self-pay | Admitting: Physical Therapy

## 2019-11-04 ENCOUNTER — Ambulatory Visit: Payer: BC Managed Care – PPO | Attending: Internal Medicine | Admitting: Physical Therapy

## 2019-11-04 ENCOUNTER — Other Ambulatory Visit: Payer: Self-pay

## 2019-11-04 DIAGNOSIS — R2681 Unsteadiness on feet: Secondary | ICD-10-CM | POA: Diagnosis not present

## 2019-11-04 DIAGNOSIS — R262 Difficulty in walking, not elsewhere classified: Secondary | ICD-10-CM

## 2019-11-04 DIAGNOSIS — M6281 Muscle weakness (generalized): Secondary | ICD-10-CM | POA: Insufficient documentation

## 2019-11-04 NOTE — Therapy (Signed)
[facility] [address]  [place], [place], [address] Phone: [phone]   Fax:  [phone]  Physical Therapy Treatment  Patient Details  Name: Kaitlyn Ashley MRN: [id number] Date of Birth: Dec 09, 1970 Referring Provider (PT): Benito Mccreedy, MD   Encounter Date: 11/04/2019   PT End of Session - 11/04/19 1140    Visit Number 17    Number of Visits 29    Date for PT Re-Evaluation 12/16/19    Authorization Type BCBS    PT Start Time 1055    PT Stop Time 1137    PT Time Calculation (min) 42 min    Activity Tolerance Patient tolerated treatment well    Behavior During Therapy --   patient emotional and crying          Past Medical History:  Diagnosis Date  . Anemia   . Hypertension   . Migraines   . Morbid obesity with BMI of 40.0-44.9, adult ([place])   . Thalassemia     Past Surgical History:  Procedure Laterality Date  . ABDOMINAL HYSTERECTOMY    . BREAST REDUCTION SURGERY    . CESAREAN SECTION      There were no vitals filed for this visit.   Subjective Assessment - 11/04/19 1056    Subjective Had a cold last week. Has not been asleep since yesterday morning d/t trouble sleeping. This has been ongoing since the 13th of August. Has not been getting benefit from sleep meds. Besides the cold and trouble sleeping, reports that she has been doing better. Has been taken out of work until she sees Neurology on 11/18/19.    Pertinent History thalassemia, migraines, HTN, anemia    Diagnostic tests CT head and MRI brain were normal during hospitalization from 04/19-04/24    Patient Stated Goals "get back to being able to walk right"    Currently in Pain? No/denies              [facility] PT Assessment - 11/04/19 0001      Assessment   Medical Diagnosis Weakness of UEs    Referring Provider (PT) Benito Mccreedy, MD    Onset Date/Surgical Date 06/01/19      Strength   Right Shoulder Flexion 4+/5     Right Shoulder ABduction 4-/5    Right Shoulder Internal Rotation 4+/5    Right Shoulder External Rotation 4+/5    Left Shoulder Flexion 4+/5    Left Shoulder ABduction 4/5    Left Shoulder Internal Rotation 4/5    Left Shoulder External Rotation 4+/5    Right Elbow Flexion 4+/5    Right Elbow Extension 4+/5    Left Elbow Flexion 4/5    Left Elbow Extension 4-/5    Right Wrist Flexion 4+/5    Right Wrist Extension 4+/5    Left Wrist Flexion 4/5    Left Wrist Extension 4+/5    Right Hand Grip (lbs) 27.67   27, 30, 26   Left Hand Grip (lbs) 16.33   17, 17, 15   Right Hip Flexion 5/5    Right Hip ABduction 4+/5    Right Hip ADduction 4+/5    Left Hip Flexion 4+/5    Left Hip ABduction 4/5    Left Hip ADduction 4/5    Right Knee Flexion 4+/5    Right Knee Extension 4+/5    Left Knee Flexion 4+/5    Left Knee Extension 4/5    Right Ankle  Dorsiflexion 5/5    Right Ankle Plantar Flexion 4/5    Left Ankle Dorsiflexion 4/5    Left Ankle Plantar Flexion 3+/5                         [facility] Adult PT Treatment/Exercise - 11/04/19 0001      Knee/Hip Exercises: Aerobic   Nustep L3 x 6 min (UE/LEs)                  PT Education - 11/04/19 1138    Education Details discussion on objective progress and remaining impairments; edu on trying to perform short durations of exercise atleast 5 minutes a day, benefits of exercise and sun exposure for mental health; adminstered info for mental health provider to address patient's lack of motivation and mood; encouraged patient to contact her PCP about lack of benefit from sleep meds    Person(s) Educated Patient    Methods Explanation;Demonstration;Tactile cues;Verbal cues;Handout    Comprehension Returned demonstration;Verbalized understanding            PT Short Term Goals - 11/04/19 1141      PT SHORT TERM GOAL #1   Title Patient to be independent with initial HEP.    Time 3    Period Weeks    Status Achieved     Target Date 08/25/19             PT Long Term Goals - 11/04/19 1124      PT LONG TERM GOAL #1   Title Patient to be independent with advanced HEP.    Time 6    Period Weeks    Status Partially Met   reports limited compliance last week d/t low motivation levels and being sick   Target Date 12/16/19      PT LONG TERM GOAL #2   Title Patient to score >22/30 on FGA in order to decrease risk of falls.    Time 6    Period Weeks    Status Achieved      PT LONG TERM GOAL #3   Title Patient to demonstrate B UE strength >/=4+/5 and grip strength >40 lbs.    Time 6    Period Weeks    Status On-going   improved L grip strength   Target Date 12/16/19      PT LONG TERM GOAL #4   Title Patient to demonstrate B LE strength >/=4+/5.    Time 6    Period Weeks    Status Partially Met   still most limited in L LE; minor regression d/t patient being tired   Target Date 12/16/19      PT LONG TERM GOAL #5   Title Patient to score <12 sec on 5xSTS without UEs in order to decrease risk of falls.    Time 6    Period Weeks    Status Achieved   08/25/19 5xSTS in 9.89 sec without UEs     PT LONG TERM GOAL #6   Title Patient to report tolerance of 60 minutes of walking/standing without fatigue limiting.    Time 6    Period Weeks    Status On-going   has been less active d/t low motivation and depression, but was recently able to walk 15 minutes with fatigue   Target Date 12/16/19                 Plan - 11/04/19 1143    Clinical Impression  Statement Patient arrived to session reporting feeling better since her cold last week. However, noting considerable trouble sleeping as she has not slept since yesterday morning. Patient notes that her current sleep medications have not been helping. Patient became emotional during the session, noting low motivation levels and depressed mood coupled with fatigue. Counseled patient on importance of completing at least 5 minutes of exercise daily and  getting daily sun exposure to improve mood and motivation levels. Administered info for a [facility] mental health provider and advised patient to contact her PCP about her sleep meds not benefitting her. Patient has demonstrated a good improvement in L grip strength today despite considerable fatigue today. However, did demonstrate a small decline in UE and LE strength d/t fatigue. Patient reported understanding of all edu provided and noted slightly improved mood at end of session. Patient is demonstrating slow but observable progress towards goals. Hopeful that addressing mental health and insomnia will allow patient to more quickly progress, and recommend that patient continues with PT 2x/week for 6 weeks to address remaining goals.    Comorbidities thalassemia, migraines, HTN, anemia    Rehab Potential Good    PT Frequency 2x / week    PT Duration 6 weeks    PT Treatment/Interventions ADLs/Self Care Home Management;Cryotherapy;Electrical Stimulation;Moist Heat;Balance training;Therapeutic exercise;Therapeutic activities;Functional mobility training;Stair training;Gait training;Ultrasound;Neuromuscular re-education;Patient/family education;Manual techniques;Vasopneumatic Device;Taping;Energy conservation;Dry needling;Passive range of motion    PT Next Visit Plan progress distal to proximal LE and UE strength    Consulted and Agree with Plan of Care Patient           Patient will benefit from skilled therapeutic intervention in order to improve the following deficits and impairments:  Decreased endurance, Cardiopulmonary status limiting activity, Decreased activity tolerance, Decreased strength, Impaired UE functional use, Pain, Decreased balance, Difficulty walking, Improper body mechanics, Postural dysfunction, Impaired flexibility  Visit Diagnosis: Muscle weakness (generalized)  Difficulty in walking, not elsewhere classified  Unsteadiness on feet     Problem List Patient Active Problem  List   Diagnosis Date Noted  . Thalassemia   . Hypertension   . Morbid obesity with BMI of 40.0-44.9, adult [facility] [address])     Janene Harvey, PT, DPT 11/04/19 11:51 AM   [facility] [address]  [address] [place], [place], [address] Phone: [phone]   Fax:  [phone]  Name: Kaitlyn Ashley MRN: [id number] Date of Birth: Jun 26, 1970

## 2019-11-04 NOTE — Patient Instructions (Addendum)
Screening for Suicide  Answer the following questions with Yes or No and place an "x" beside the action taken.  1. Over the past two weeks, have you felt down, depressed, or hopeless?   yes  2. Within the past two weeks, have you felt little interest or pleasure in life?  yes  If YES to either #1 or #2, then ask #3  3. Have you had thoughts that that life is not worth living or that you might be  no     better off dead?     If answer is NO and suspicion is low, then end   4. Over this past week, have you had any thoughts about hurting or even killing yourself?  no  If NO, then end. Patient in no immediate danger   5. If so, do you believe that you intend to or will harm yourself?  no     If NO, then end. Patient in no immediate danger   6.  Do you have a plan as to how you would hurt yourself?     7.  Over this past week, have you actually done anything to hurt yourself?    IF YES answers to either #4, #5, #6 or #7, then patient is AT RISK for suicide   Actions Taken  ____  Screening negative; no further action required  __x__  Screening positive; no immediate danger and patient already in treatment with a  mental health provider. Advise patient to speak to their mental health provider.  ____  Screening positive; no immediate danger. Patient advised to contact a mental  health provider for further assessment.   ____  Screening positive; in immediate danger as patient states intention of killing self,  has plan and a sense of imminence. Do not leave alone. Seek permission from  patient to contact a family member to inform them. Direct patient to go to ED.

## 2019-11-17 ENCOUNTER — Ambulatory Visit: Payer: BC Managed Care – PPO | Admitting: Physical Therapy

## 2019-11-18 DIAGNOSIS — G610 Guillain-Barre syndrome: Secondary | ICD-10-CM | POA: Diagnosis not present

## 2019-11-19 ENCOUNTER — Ambulatory Visit: Payer: BC Managed Care – PPO

## 2019-11-19 DIAGNOSIS — Z20822 Contact with and (suspected) exposure to covid-19: Secondary | ICD-10-CM | POA: Diagnosis not present

## 2019-11-23 DIAGNOSIS — R1031 Right lower quadrant pain: Secondary | ICD-10-CM | POA: Diagnosis not present

## 2019-11-23 DIAGNOSIS — Z01419 Encounter for gynecological examination (general) (routine) without abnormal findings: Secondary | ICD-10-CM | POA: Diagnosis not present

## 2019-11-23 DIAGNOSIS — Z8669 Personal history of other diseases of the nervous system and sense organs: Secondary | ICD-10-CM | POA: Diagnosis not present

## 2019-11-23 DIAGNOSIS — Z90721 Acquired absence of ovaries, unilateral: Secondary | ICD-10-CM | POA: Diagnosis not present

## 2019-11-23 DIAGNOSIS — Z90711 Acquired absence of uterus with remaining cervical stump: Secondary | ICD-10-CM | POA: Diagnosis not present

## 2019-11-23 DIAGNOSIS — R102 Pelvic and perineal pain: Secondary | ICD-10-CM | POA: Diagnosis not present

## 2019-11-24 ENCOUNTER — Ambulatory Visit: Payer: BC Managed Care – PPO | Attending: Internal Medicine

## 2019-11-24 ENCOUNTER — Other Ambulatory Visit: Payer: Self-pay

## 2019-11-24 DIAGNOSIS — R2681 Unsteadiness on feet: Secondary | ICD-10-CM | POA: Diagnosis not present

## 2019-11-24 DIAGNOSIS — M6281 Muscle weakness (generalized): Secondary | ICD-10-CM | POA: Insufficient documentation

## 2019-11-24 DIAGNOSIS — R262 Difficulty in walking, not elsewhere classified: Secondary | ICD-10-CM
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
## 2019-11-26 ENCOUNTER — Other Ambulatory Visit: Payer: Self-pay | Admitting: Physician Assistant

## 2019-11-26 ENCOUNTER — Other Ambulatory Visit: Payer: Self-pay

## 2019-11-26 ENCOUNTER — Ambulatory Visit: Payer: BC Managed Care – PPO

## 2019-11-26 DIAGNOSIS — M6281 Muscle weakness (generalized): Secondary | ICD-10-CM

## 2019-11-26 DIAGNOSIS — R262 Difficulty in walking, not elsewhere classified: Secondary | ICD-10-CM

## 2019-11-26 DIAGNOSIS — R2681 Unsteadiness on feet: Secondary | ICD-10-CM

## 2019-11-26 DIAGNOSIS — Z1231 Encounter for screening mammogram for malignant neoplasm of breast: Secondary | ICD-10-CM

## 2019-11-26 NOTE — Therapy (Signed)
[facility] [address]  [place], [place], [address] Phone: [phone]   Fax:  [phone]  Physical Therapy Treatment  Patient Details  Name: Kaitlyn [place] MRN: [id number] Date of Birth: [birth year] Referring Provider (PT): Benito Mccreedy, MD   Encounter Date: 11/26/2019   PT End of Session - 11/26/19 1022    Visit Number 19    Number of Visits 29    Date for PT Re-Evaluation 12/16/19    Authorization Type BCBS    PT Start Time 1015    PT Stop Time 1053    PT Time Calculation (min) 38 min    Activity Tolerance Patient tolerated treatment well    Behavior During Therapy [facility] for tasks assessed/performed           Past Medical History:  Diagnosis Date  . Anemia   . Hypertension   . Migraines   . Morbid obesity with BMI of 40.0-44.9, adult ([place])   . Thalassemia     Past Surgical History:  Procedure Laterality Date  . ABDOMINAL HYSTERECTOMY    . BREAST REDUCTION SURGERY    . CESAREAN SECTION      There were no vitals filed for this visit.   Subjective Assessment - 11/26/19 1025    Subjective Pt. doing ok.    Pertinent History thalassemia, migraines, HTN, anemia    Diagnostic tests CT head and MRI brain were normal during hospitalization from 04/19-04/24    Patient Stated Goals "get back to being able to walk right"    Currently in Pain? No/denies    Pain Score 0-No pain    Multiple Pain Sites No                             [facility] Adult PT Treatment/Exercise - 11/26/19 0001      Knee/Hip Exercises: Aerobic   Nustep L5 x 6 min (UE/LEs)      Knee/Hip Exercises: Machines for Strengthening   Cybex Leg Press B LE's: 35# x 10      Knee/Hip Exercises: Standing   Heel Raises Both;20 reps    Heel Raises Limitations heel/toe raise with ski poles     Hip Extension Right;Left;10 reps;Knee bent    Extension Limitations B fitter hip extension with chair support      Knee/Hip  Exercises: Seated   Long Arc Quad Right;Left;10 reps;Weights;Strengthening    Long Arc Quad Weight 3 lbs.    Long [employer] Limitations cues for [employer] to [employer] 2 sets;10 reps   hip abd/ER into red looped TB     Knee/Hip Exercises: Supine   Bridges with Clamshell Both;Strengthening;15 reps   + isometric hip abd/ER into red TB at knees                    PT Short Term Goals - 11/04/19 1141      PT SHORT TERM GOAL #1   Title Patient to be independent with initial HEP.    Time 3    Period Weeks    Status Achieved    Target Date 08/25/19             PT Long Term Goals - 11/04/19 1124      PT LONG TERM GOAL #1   Title Patient to be independent with advanced HEP.    Time 6    Period Weeks  Status Partially Met   reports limited compliance last week d/t low motivation levels and being sick   Target Date 12/16/19      PT LONG TERM GOAL #2   Title Patient to score >22/30 on FGA in order to decrease risk of falls.    Time 6    Period Weeks    Status Achieved      PT LONG TERM GOAL #3   Title Patient to demonstrate B UE strength >/=4+/5 and grip strength >40 lbs.    Time 6    Period Weeks    Status On-going   improved L grip strength   Target Date 12/16/19      PT LONG TERM GOAL #4   Title Patient to demonstrate B LE strength >/=4+/5.    Time 6    Period Weeks    Status Partially Met   still most limited in L LE; minor regression d/t patient being tired   Target Date 12/16/19      PT LONG TERM GOAL #5   Title Patient to score <12 sec on 5xSTS without UEs in order to decrease risk of falls.    Time 6    Period Weeks    Status Achieved   08/25/19 5xSTS in 9.89 sec without UEs     PT LONG TERM GOAL #6   Title Patient to report tolerance of 60 minutes of walking/standing without fatigue limiting.    Time 6    Period Weeks    Status On-going   has been less active d/t low motivation and depression, but was recently able to walk 15 minutes with fatigue    Target Date 12/16/19                 Plan - 11/26/19 1022    Clinical Impression Statement Pt. noting ~ 30% improvement since starting therapy.  Notes least improved factor for her is how easily fatigued she become throughout her day.  Has significantly improved her walking tolerance in neighborhood.  Progressed LE strengthening to include progression of bridging, side stepping with red TB resistance, and standing fitter leg press.  Pt. pain free throughout session and did not observe LE shaking/tremor today as seen in previous visits.    Comorbidities thalassemia, migraines, HTN, anemia    Rehab Potential Good    PT Frequency 2x / week    PT Duration 6 weeks    PT Treatment/Interventions ADLs/Self Care Home Management;Cryotherapy;Electrical Stimulation;Moist Heat;Balance training;Therapeutic exercise;Therapeutic activities;Functional mobility training;Stair training;Gait training;Ultrasound;Neuromuscular re-education;Patient/family education;Manual techniques;Vasopneumatic Device;Taping;Energy conservation;Dry needling;Passive range of motion    PT Next Visit Plan progress distal to proximal LE and UE strength    Consulted and Agree with Plan of Care Patient           Patient will benefit from skilled therapeutic intervention in order to improve the following deficits and impairments:  Decreased endurance, Cardiopulmonary status limiting activity, Decreased activity tolerance, Decreased strength, Impaired UE functional use, Pain, Decreased balance, Difficulty walking, Improper body mechanics, Postural dysfunction, Impaired flexibility  Visit Diagnosis: Muscle weakness (generalized)  Difficulty in walking, not elsewhere classified  Unsteadiness on feet     Problem List Patient Active Problem List   Diagnosis Date Noted  . Thalassemia   . Hypertension   . Morbid obesity with BMI of 40.0-44.9, adult [facility])     Bess Harvest, PTA 11/26/19 12:41 PM   [facility] [address]  [address] [place],  [place], [address] Phone: [phone]   Fax:  [phone]  Name: Kaitlyn [place] MRN: [id number] Date of Birth: 1970-11-12

## 2019-12-01 ENCOUNTER — Ambulatory Visit: Payer: BC Managed Care – PPO | Admitting: Physical Therapy

## 2019-12-01 ENCOUNTER — Encounter: Payer: Self-pay | Admitting: Physical Therapy

## 2019-12-01 ENCOUNTER — Other Ambulatory Visit: Payer: Self-pay

## 2019-12-01 DIAGNOSIS — R2681 Unsteadiness on feet: Secondary | ICD-10-CM

## 2019-12-01 DIAGNOSIS — R262 Difficulty in walking, not elsewhere classified: Secondary | ICD-10-CM | POA: Diagnosis not present

## 2019-12-01 DIAGNOSIS — M6281 Muscle weakness (generalized): Secondary | ICD-10-CM | POA: Diagnosis not present
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
## 2019-12-03 ENCOUNTER — Ambulatory Visit: Payer: BC Managed Care – PPO

## 2019-12-03 DIAGNOSIS — N179 Acute kidney failure, unspecified: Secondary | ICD-10-CM | POA: Diagnosis not present

## 2019-12-03 DIAGNOSIS — G43909 Migraine, unspecified, not intractable, without status migrainosus: Secondary | ICD-10-CM | POA: Diagnosis not present

## 2019-12-03 DIAGNOSIS — I10 Essential (primary) hypertension: Secondary | ICD-10-CM | POA: Diagnosis not present

## 2019-12-03 DIAGNOSIS — E876 Hypokalemia: Secondary | ICD-10-CM | POA: Diagnosis not present

## 2019-12-08 ENCOUNTER — Ambulatory Visit: Payer: BC Managed Care – PPO | Admitting: Physical Therapy

## 2019-12-10 ENCOUNTER — Ambulatory Visit: Payer: BC Managed Care – PPO | Admitting: Physical Therapy

## 2019-12-18 ENCOUNTER — Other Ambulatory Visit: Payer: Self-pay

## 2019-12-18 ENCOUNTER — Ambulatory Visit
Admission: RE | Admit: 2019-12-18 | Discharge: 2019-12-18 | Disposition: A | Discharge status: Home / Self Care | Payer: BC Managed Care – PPO | Source: Ambulatory Visit | Attending: Physician Assistant | Admitting: Physician Assistant

## 2019-12-18 DIAGNOSIS — Z1231 Encounter for screening mammogram for malignant neoplasm of breast: Secondary | ICD-10-CM

## 2020-01-01 DIAGNOSIS — E876 Hypokalemia: Secondary | ICD-10-CM | POA: Diagnosis not present

## 2020-01-01 DIAGNOSIS — I10 Essential (primary) hypertension: Secondary | ICD-10-CM | POA: Diagnosis not present

## 2020-01-01 DIAGNOSIS — G610 Guillain-Barre syndrome: Secondary | ICD-10-CM | POA: Diagnosis not present

## 2020-01-19 ENCOUNTER — Encounter: Payer: Self-pay | Admitting: *Deleted

## 2020-01-19 ENCOUNTER — Ambulatory Visit: Payer: BC Managed Care – PPO | Admitting: Neurology

## 2020-01-20 ENCOUNTER — Telehealth: Payer: Self-pay | Admitting: *Deleted

## 2020-01-20 ENCOUNTER — Ambulatory Visit: Payer: BC Managed Care – PPO | Admitting: Diagnostic Neuroimaging

## 2020-01-20 ENCOUNTER — Encounter: Payer: Self-pay | Admitting: Diagnostic Neuroimaging

## 2020-01-20 NOTE — Telephone Encounter (Signed)
Patient was no show for new patient appointment today. 

## 2020-02-01 ENCOUNTER — Ambulatory Visit: Payer: BC Managed Care – PPO | Admitting: Neurology

## 2020-02-02 DIAGNOSIS — Z79899 Other long term (current) drug therapy: Secondary | ICD-10-CM | POA: Diagnosis not present

## 2020-02-02 DIAGNOSIS — I10 Essential (primary) hypertension: Secondary | ICD-10-CM | POA: Diagnosis not present

## 2020-02-02 DIAGNOSIS — R635 Abnormal weight gain: Secondary | ICD-10-CM | POA: Diagnosis not present

## 2020-02-02 DIAGNOSIS — R7303 Prediabetes: Secondary | ICD-10-CM | POA: Diagnosis not present

## 2020-02-28 DIAGNOSIS — Z03818 Encounter for observation for suspected exposure to other biological agents ruled out: Secondary | ICD-10-CM | POA: Diagnosis not present

## 2020-02-28 DIAGNOSIS — Z20822 Contact with and (suspected) exposure to covid-19: Secondary | ICD-10-CM | POA: Diagnosis not present

## 2020-03-17 DIAGNOSIS — G610 Guillain-Barre syndrome: Secondary | ICD-10-CM | POA: Diagnosis not present

## 2020-03-17 DIAGNOSIS — F419 Anxiety disorder, unspecified: Secondary | ICD-10-CM | POA: Diagnosis not present

## 2020-03-17 DIAGNOSIS — I10 Essential (primary) hypertension: Secondary | ICD-10-CM | POA: Diagnosis not present

## 2020-03-17 DIAGNOSIS — R202 Paresthesia of skin: Secondary | ICD-10-CM | POA: Diagnosis not present

## 2020-04-19 DIAGNOSIS — R635 Abnormal weight gain: Secondary | ICD-10-CM | POA: Diagnosis not present

## 2020-04-19 DIAGNOSIS — R7303 Prediabetes: Secondary | ICD-10-CM | POA: Diagnosis not present

## 2020-04-19 DIAGNOSIS — F419 Anxiety disorder, unspecified: Secondary | ICD-10-CM | POA: Diagnosis not present

## 2020-04-19 DIAGNOSIS — I10 Essential (primary) hypertension: Secondary | ICD-10-CM | POA: Diagnosis not present

## 2020-04-19 DIAGNOSIS — G610 Guillain-Barre syndrome: Secondary | ICD-10-CM | POA: Diagnosis not present

## 2020-05-04 DIAGNOSIS — N938 Other specified abnormal uterine and vaginal bleeding: Secondary | ICD-10-CM | POA: Diagnosis not present

## 2020-05-04 DIAGNOSIS — R1030 Lower abdominal pain, unspecified: Secondary | ICD-10-CM | POA: Diagnosis not present

## 2020-05-04 DIAGNOSIS — Z90710 Acquired absence of both cervix and uterus: Secondary | ICD-10-CM | POA: Diagnosis not present

## 2020-05-24 DIAGNOSIS — L700 Acne vulgaris: Secondary | ICD-10-CM | POA: Diagnosis not present

## 2020-05-24 DIAGNOSIS — L720 Epidermal cyst: Secondary | ICD-10-CM | POA: Diagnosis not present

## 2020-05-31 DIAGNOSIS — R102 Pelvic and perineal pain: Secondary | ICD-10-CM | POA: Diagnosis not present

## 2020-05-31 DIAGNOSIS — R1030 Lower abdominal pain, unspecified: Secondary | ICD-10-CM | POA: Diagnosis not present

## 2020-05-31 DIAGNOSIS — N888 Other specified noninflammatory disorders of cervix uteri: Secondary | ICD-10-CM | POA: Diagnosis not present

## 2020-05-31 DIAGNOSIS — N938 Other specified abnormal uterine and vaginal bleeding: Secondary | ICD-10-CM | POA: Diagnosis not present

## 2020-05-31 DIAGNOSIS — Z90710 Acquired absence of both cervix and uterus: Secondary | ICD-10-CM | POA: Diagnosis not present

## 2020-05-31 DIAGNOSIS — Z8742 Personal history of other diseases of the female genital tract: Secondary | ICD-10-CM | POA: Diagnosis not present

## 2020-06-13 DIAGNOSIS — Z131 Encounter for screening for diabetes mellitus: Secondary | ICD-10-CM | POA: Diagnosis not present

## 2020-06-13 DIAGNOSIS — G610 Guillain-Barre syndrome: Secondary | ICD-10-CM | POA: Diagnosis not present

## 2020-06-13 DIAGNOSIS — I10 Essential (primary) hypertension: Secondary | ICD-10-CM | POA: Diagnosis not present

## 2020-06-13 DIAGNOSIS — F419 Anxiety disorder, unspecified: Secondary | ICD-10-CM | POA: Diagnosis not present

## 2020-06-13 DIAGNOSIS — Z0000 Encounter for general adult medical examination without abnormal findings: Secondary | ICD-10-CM | POA: Diagnosis not present

## 2020-06-14 DIAGNOSIS — L720 Epidermal cyst: Secondary | ICD-10-CM | POA: Diagnosis not present

## 2020-06-21 DIAGNOSIS — L905 Scar conditions and fibrosis of skin: Secondary | ICD-10-CM | POA: Diagnosis not present

## 2020-06-21 DIAGNOSIS — L089 Local infection of the skin and subcutaneous tissue, unspecified: Secondary | ICD-10-CM | POA: Diagnosis not present

## 2020-06-23 DIAGNOSIS — Z4802 Encounter for removal of sutures: Secondary | ICD-10-CM | POA: Diagnosis not present

## 2020-08-29 DIAGNOSIS — R635 Abnormal weight gain: Secondary | ICD-10-CM | POA: Diagnosis not present

## 2020-08-29 DIAGNOSIS — Z6836 Body mass index (BMI) 36.0-36.9, adult: Secondary | ICD-10-CM | POA: Diagnosis not present

## 2020-09-22 DIAGNOSIS — U071 COVID-19: Secondary | ICD-10-CM | POA: Diagnosis not present

## 2020-11-02 ENCOUNTER — Encounter (HOSPITAL_BASED_OUTPATIENT_CLINIC_OR_DEPARTMENT_OTHER): Payer: Self-pay | Admitting: *Deleted

## 2020-11-02 ENCOUNTER — Other Ambulatory Visit: Payer: Self-pay

## 2020-11-02 DIAGNOSIS — Z20822 Contact with and (suspected) exposure to covid-19: Secondary | ICD-10-CM | POA: Insufficient documentation

## 2020-11-02 DIAGNOSIS — K8020 Calculus of gallbladder without cholecystitis without obstruction: Secondary | ICD-10-CM | POA: Diagnosis not present

## 2020-11-02 DIAGNOSIS — A5903 Trichomonal cystitis and urethritis: Secondary | ICD-10-CM | POA: Diagnosis not present

## 2020-11-02 DIAGNOSIS — I10 Essential (primary) hypertension: Secondary | ICD-10-CM | POA: Insufficient documentation

## 2020-11-02 DIAGNOSIS — R1011 Right upper quadrant pain: Secondary | ICD-10-CM | POA: Diagnosis not present

## 2020-11-02 DIAGNOSIS — R109 Unspecified abdominal pain: Secondary | ICD-10-CM | POA: Diagnosis not present

## 2020-11-02 DIAGNOSIS — K8012 Calculus of gallbladder with acute and chronic cholecystitis without obstruction: Principal | ICD-10-CM | POA: Insufficient documentation

## 2020-11-02 DIAGNOSIS — M545 Low back pain, unspecified: Secondary | ICD-10-CM | POA: Diagnosis not present

## 2020-11-02 DIAGNOSIS — Z91040 Latex allergy status: Secondary | ICD-10-CM | POA: Diagnosis not present

## 2020-11-02 DIAGNOSIS — R110 Nausea: Secondary | ICD-10-CM | POA: Diagnosis not present

## 2020-11-02 DIAGNOSIS — K819 Cholecystitis, unspecified: Secondary | ICD-10-CM | POA: Diagnosis not present

## 2020-11-02 DIAGNOSIS — Z79899 Other long term (current) drug therapy: Secondary | ICD-10-CM | POA: Insufficient documentation

## 2020-11-02 DIAGNOSIS — R1110 Vomiting, unspecified: Secondary | ICD-10-CM | POA: Diagnosis not present

## 2020-11-02 NOTE — ED Triage Notes (Signed)
Pt reports drove self to ED , states " I have pain all over"  x 1 hr

## 2020-11-03 ENCOUNTER — Emergency Department (HOSPITAL_COMMUNITY): Payer: BC Managed Care – PPO | Admitting: Anesthesiology

## 2020-11-03 ENCOUNTER — Encounter (HOSPITAL_COMMUNITY)
Admission: EM | Disposition: A | Discharge status: Home / Self Care | Payer: Self-pay | Source: Home / Self Care | Attending: Emergency Medicine

## 2020-11-03 ENCOUNTER — Emergency Department (HOSPITAL_BASED_OUTPATIENT_CLINIC_OR_DEPARTMENT_OTHER): Payer: BC Managed Care – PPO

## 2020-11-03 ENCOUNTER — Observation Stay (HOSPITAL_BASED_OUTPATIENT_CLINIC_OR_DEPARTMENT_OTHER)
Admission: EM | Admit: 2020-11-03 | Discharge: 2020-11-04 | Disposition: A | Discharge status: Home / Self Care | Payer: BC Managed Care – PPO | Attending: General Surgery | Admitting: General Surgery

## 2020-11-03 ENCOUNTER — Encounter (HOSPITAL_COMMUNITY): Payer: Self-pay | Admitting: General Surgery

## 2020-11-03 DIAGNOSIS — K819 Cholecystitis, unspecified: Secondary | ICD-10-CM

## 2020-11-03 DIAGNOSIS — R1110 Vomiting, unspecified: Secondary | ICD-10-CM | POA: Diagnosis not present

## 2020-11-03 DIAGNOSIS — K810 Acute cholecystitis: Secondary | ICD-10-CM | POA: Diagnosis present

## 2020-11-03 DIAGNOSIS — Z91040 Latex allergy status: Secondary | ICD-10-CM | POA: Diagnosis not present

## 2020-11-03 DIAGNOSIS — K8020 Calculus of gallbladder without cholecystitis without obstruction: Secondary | ICD-10-CM | POA: Diagnosis not present

## 2020-11-03 DIAGNOSIS — I10 Essential (primary) hypertension: Secondary | ICD-10-CM | POA: Diagnosis not present

## 2020-11-03 DIAGNOSIS — G43909 Migraine, unspecified, not intractable, without status migrainosus: Secondary | ICD-10-CM | POA: Diagnosis not present

## 2020-11-03 DIAGNOSIS — A5903 Trichomonal cystitis and urethritis: Secondary | ICD-10-CM | POA: Diagnosis not present

## 2020-11-03 DIAGNOSIS — K8000 Calculus of gallbladder with acute cholecystitis without obstruction: Secondary | ICD-10-CM | POA: Diagnosis not present

## 2020-11-03 DIAGNOSIS — R1011 Right upper quadrant pain: Secondary | ICD-10-CM

## 2020-11-03 DIAGNOSIS — R109 Unspecified abdominal pain: Secondary | ICD-10-CM | POA: Diagnosis not present

## 2020-11-03 DIAGNOSIS — R110 Nausea: Secondary | ICD-10-CM | POA: Diagnosis not present

## 2020-11-03 DIAGNOSIS — M549 Dorsalgia, unspecified: Secondary | ICD-10-CM

## 2020-11-03 DIAGNOSIS — K8012 Calculus of gallbladder with acute and chronic cholecystitis without obstruction: Secondary | ICD-10-CM | POA: Diagnosis not present

## 2020-11-03 DIAGNOSIS — Z79899 Other long term (current) drug therapy: Secondary | ICD-10-CM | POA: Diagnosis not present

## 2020-11-03 DIAGNOSIS — I429 Cardiomyopathy, unspecified: Secondary | ICD-10-CM | POA: Diagnosis not present

## 2020-11-03 DIAGNOSIS — Z20822 Contact with and (suspected) exposure to covid-19: Secondary | ICD-10-CM | POA: Diagnosis not present

## 2020-11-03 DIAGNOSIS — K8010 Calculus of gallbladder with chronic cholecystitis without obstruction: Secondary | ICD-10-CM | POA: Diagnosis not present

## 2020-11-03 HISTORY — PX: CHOLECYSTECTOMY: SHX55

## 2020-11-03 LAB — TYPE AND SCREEN
ABO/RH(D): AB POS
Antibody Screen: NEGATIVE

## 2020-11-03 LAB — COMPREHENSIVE METABOLIC PANEL
ALT: 15 U/L (ref 0–44)
AST: 16 U/L (ref 15–41)
Albumin: 3.8 g/dL (ref 3.5–5.0)
Alkaline Phosphatase: 74 U/L (ref 38–126)
Anion gap: 8 (ref 5–15)
BUN: 15 mg/dL (ref 6–20)
CO2: 30 mmol/L (ref 22–32)
Calcium: 9.3 mg/dL (ref 8.9–10.3)
Chloride: 99 mmol/L (ref 98–111)
Creatinine, Ser: 1.22 mg/dL — ABNORMAL HIGH (ref 0.44–1.00)
GFR, Estimated: 55 mL/min — ABNORMAL LOW (ref >60)
Glucose, Bld: 117 mg/dL — ABNORMAL HIGH (ref 70–99)
Potassium: 3.5 mmol/L (ref 3.5–5.1)
Sodium: 137 mmol/L (ref 135–145)
Total Bilirubin: 0.3 mg/dL (ref 0.3–1.2)
Total Protein: 8.2 g/dL — ABNORMAL HIGH (ref 6.5–8.1)

## 2020-11-03 LAB — URINALYSIS, ROUTINE W REFLEX MICROSCOPIC
Bilirubin Urine: NEGATIVE
Glucose, UA: NEGATIVE mg/dL
Ketones, ur: NEGATIVE mg/dL
Nitrite: NEGATIVE
Protein, ur: NEGATIVE mg/dL
Specific Gravity, Urine: 1.01 (ref 1.005–1.030)
pH: 7.5 (ref 5.0–8.0)

## 2020-11-03 LAB — CBC
HCT: 42.7 % (ref 36.0–46.0)
Hemoglobin: 13.4 g/dL (ref 12.0–15.0)
MCH: 24.4 pg — ABNORMAL LOW (ref 26.0–34.0)
MCHC: 31.4 g/dL (ref 30.0–36.0)
MCV: 77.8 fL — ABNORMAL LOW (ref 80.0–100.0)
Platelets: 258 10*3/uL (ref 150–400)
RBC: 5.49 MIL/uL — ABNORMAL HIGH (ref 3.87–5.11)
RDW: 15.1 % (ref 11.5–15.5)
WBC: 8.9 10*3/uL (ref 4.0–10.5)
nRBC: 0 % (ref 0.0–0.2)

## 2020-11-03 LAB — URINALYSIS, MICROSCOPIC (REFLEX)

## 2020-11-03 LAB — ABO/RH: ABO/RH(D): AB POS

## 2020-11-03 LAB — PROTIME-INR
INR: 0.9 (ref 0.8–1.2)
Prothrombin Time: 12.6 seconds (ref 11.4–15.2)

## 2020-11-03 LAB — RESP PANEL BY RT-PCR (FLU A&B, COVID) ARPGX2
Influenza A by PCR: NEGATIVE
Influenza B by PCR: NEGATIVE
SARS Coronavirus 2 by RT PCR: NEGATIVE

## 2020-11-03 LAB — APTT: aPTT: 27 seconds (ref 24–36)

## 2020-11-03 LAB — FIBRINOGEN: Fibrinogen: 474 mg/dL (ref 210–475)

## 2020-11-03 LAB — LIPASE, BLOOD: Lipase: 64 U/L — ABNORMAL HIGH (ref 11–51)

## 2020-11-03 SURGERY — LAPAROSCOPIC CHOLECYSTECTOMY WITH INTRAOPERATIVE CHOLANGIOGRAM
Anesthesia: General | Site: Abdomen

## 2020-11-03 MED ORDER — ATENOLOL 50 MG PO TABS
50.0000 mg | ORAL_TABLET | Freq: Every day | ORAL | Status: DC
  Administered 2020-11-04: 50 mg via ORAL
  Filled 2020-11-03: qty 1

## 2020-11-03 MED ORDER — ONDANSETRON HCL 4 MG/2ML IJ SOLN
INTRAMUSCULAR | Status: AC
  Administered 2020-11-03: 4 mg via INTRAVENOUS
  Filled 2020-11-03: qty 2

## 2020-11-03 MED ORDER — PHENYLEPHRINE 40 MCG/ML (10ML) SYRINGE FOR IV PUSH (FOR BLOOD PRESSURE SUPPORT)
PREFILLED_SYRINGE | INTRAVENOUS | Status: DC | PRN
  Administered 2020-11-03: 80 ug via INTRAVENOUS

## 2020-11-03 MED ORDER — POTASSIUM CHLORIDE IN NACL 20-0.45 MEQ/L-% IV SOLN: INTRAVENOUS | Status: DC

## 2020-11-03 MED ORDER — FENTANYL CITRATE PF 50 MCG/ML IJ SOSY
PREFILLED_SYRINGE | INTRAMUSCULAR | Status: AC
  Filled 2020-11-03: qty 1

## 2020-11-03 MED ORDER — SODIUM CHLORIDE 0.9 % IV SOLN
2.0000 g | Freq: Once | INTRAVENOUS | Status: AC
  Administered 2020-11-03: 2 g via INTRAVENOUS
  Filled 2020-11-03: qty 20

## 2020-11-03 MED ORDER — PROPOFOL 10 MG/ML IV BOLUS
INTRAVENOUS | Status: DC | PRN
  Administered 2020-11-03: 160 mg via INTRAVENOUS

## 2020-11-03 MED ORDER — OXYCODONE HCL 5 MG PO TABS
5.0000 mg | ORAL_TABLET | Freq: Once | ORAL | Status: AC | PRN
  Administered 2020-11-03: 5 mg via ORAL

## 2020-11-03 MED ORDER — ACETAMINOPHEN 10 MG/ML IV SOLN: 1000.0000 mg | Freq: Once | INTRAVENOUS | Status: DC | PRN

## 2020-11-03 MED ORDER — MIDAZOLAM HCL 2 MG/2ML IJ SOLN
INTRAMUSCULAR | Status: AC
  Filled 2020-11-03: qty 2

## 2020-11-03 MED ORDER — DEXAMETHASONE SODIUM PHOSPHATE 10 MG/ML IJ SOLN
INTRAMUSCULAR | Status: AC
  Filled 2020-11-03: qty 1

## 2020-11-03 MED ORDER — SCOPOLAMINE 1 MG/3DAYS TD PT72
1.0000 | MEDICATED_PATCH | TRANSDERMAL | Status: DC
  Administered 2020-11-03: 1.5 mg via TRANSDERMAL
  Filled 2020-11-03: qty 1

## 2020-11-03 MED ORDER — METOPROLOL TARTRATE 5 MG/5ML IV SOLN: 5.0000 mg | Freq: Four times a day (QID) | INTRAVENOUS | Status: DC | PRN

## 2020-11-03 MED ORDER — BUPIVACAINE-EPINEPHRINE 0.25% -1:200000 IJ SOLN
INTRAMUSCULAR | Status: DC | PRN
  Administered 2020-11-03: 30 mL

## 2020-11-03 MED ORDER — ONDANSETRON HCL 4 MG/2ML IJ SOLN: 4.0000 mg | INTRAMUSCULAR | Status: DC | PRN

## 2020-11-03 MED ORDER — LACTATED RINGERS IV SOLN
INTRAVENOUS | Status: AC | PRN
  Administered 2020-11-03: 1000 mL

## 2020-11-03 MED ORDER — LACTATED RINGERS IV SOLN: INTRAVENOUS | Status: DC

## 2020-11-03 MED ORDER — DOCUSATE SODIUM 100 MG PO CAPS
100.0000 mg | ORAL_CAPSULE | Freq: Two times a day (BID) | ORAL | Status: DC
  Administered 2020-11-03 – 2020-11-04 (×2): 100 mg via ORAL
  Filled 2020-11-03 (×2): qty 1

## 2020-11-03 MED ORDER — ACETAMINOPHEN 160 MG/5ML PO SOLN: 325.0000 mg | ORAL | Status: DC | PRN

## 2020-11-03 MED ORDER — FENTANYL CITRATE (PF) 100 MCG/2ML IJ SOLN
INTRAMUSCULAR | Status: DC | PRN
  Administered 2020-11-03 (×4): 50 ug via INTRAVENOUS

## 2020-11-03 MED ORDER — SODIUM CHLORIDE 0.9 % IV BOLUS
1000.0000 mL | Freq: Once | INTRAVENOUS | Status: AC
  Administered 2020-11-03: 1000 mL via INTRAVENOUS

## 2020-11-03 MED ORDER — HYDROMORPHONE HCL 1 MG/ML IJ SOLN
1.0000 mg | Freq: Once | INTRAMUSCULAR | Status: AC
  Administered 2020-11-03: 1 mg via INTRAVENOUS
  Filled 2020-11-03: qty 1

## 2020-11-03 MED ORDER — LIDOCAINE 2% (20 MG/ML) 5 ML SYRINGE
INTRAMUSCULAR | Status: DC | PRN
  Administered 2020-11-03: 40 mg via INTRAVENOUS

## 2020-11-03 MED ORDER — SIMETHICONE 80 MG PO CHEW: 40.0000 mg | CHEWABLE_TABLET | Freq: Four times a day (QID) | ORAL | Status: DC | PRN

## 2020-11-03 MED ORDER — DEXAMETHASONE SODIUM PHOSPHATE 10 MG/ML IJ SOLN
INTRAMUSCULAR | Status: DC | PRN
  Administered 2020-11-03: 6 mg via INTRAVENOUS

## 2020-11-03 MED ORDER — HEMOSTATIC AGENTS (NO CHARGE) OPTIME
TOPICAL | Status: DC | PRN
  Administered 2020-11-03: 1

## 2020-11-03 MED ORDER — MIDAZOLAM HCL 5 MG/5ML IJ SOLN
INTRAMUSCULAR | Status: DC | PRN
  Administered 2020-11-03: 2 mg via INTRAVENOUS

## 2020-11-03 MED ORDER — SUGAMMADEX SODIUM 200 MG/2ML IV SOLN
INTRAVENOUS | Status: DC | PRN
Start: 2020-11-03 — End: 2020-11-03
  Administered 2020-11-03: 200 mg via INTRAVENOUS

## 2020-11-03 MED ORDER — OXYCODONE HCL 5 MG/5ML PO SOLN
5.0000 mg | Freq: Once | ORAL | Status: AC | PRN
Start: 2020-11-03 — End: 2020-11-03

## 2020-11-03 MED ORDER — OXYCODONE HCL 5 MG PO TABS
ORAL_TABLET | ORAL | Status: AC
  Filled 2020-11-03: qty 1

## 2020-11-03 MED ORDER — OXYCODONE HCL 5 MG PO TABS
5.0000 mg | ORAL_TABLET | ORAL | Status: DC | PRN
  Administered 2020-11-03 – 2020-11-04 (×2): 5 mg via ORAL
  Filled 2020-11-03 (×2): qty 1

## 2020-11-03 MED ORDER — AMISULPRIDE (ANTIEMETIC) 5 MG/2ML IV SOLN: 10.0000 mg | Freq: Once | INTRAVENOUS | Status: DC | PRN

## 2020-11-03 MED ORDER — CHLORTHALIDONE 25 MG PO TABS
25.0000 mg | ORAL_TABLET | Freq: Every day | ORAL | Status: DC
  Administered 2020-11-04: 25 mg via ORAL
  Filled 2020-11-03: qty 1

## 2020-11-03 MED ORDER — 0.9 % SODIUM CHLORIDE (POUR BTL) OPTIME
TOPICAL | Status: DC | PRN
  Administered 2020-11-03: 1000 mL

## 2020-11-03 MED ORDER — CHLORHEXIDINE GLUCONATE 0.12 % MT SOLN
15.0000 mL | Freq: Once | OROMUCOSAL | Status: AC
  Administered 2020-11-03: 15 mL via OROMUCOSAL

## 2020-11-03 MED ORDER — ATENOLOL-CHLORTHALIDONE 50-25 MG PO TABS: 1.0000 | ORAL_TABLET | Freq: Every day | ORAL | Status: DC

## 2020-11-03 MED ORDER — POTASSIUM CHLORIDE IN NACL 20-0.45 MEQ/L-% IV SOLN
INTRAVENOUS | Status: DC
  Filled 2020-11-03: qty 1000

## 2020-11-03 MED ORDER — ALBUTEROL SULFATE (2.5 MG/3ML) 0.083% IN NEBU: 3.0000 mL | INHALATION_SOLUTION | Freq: Four times a day (QID) | RESPIRATORY_TRACT | Status: DC | PRN

## 2020-11-03 MED ORDER — GABAPENTIN 300 MG PO CAPS: 600.0000 mg | ORAL_CAPSULE | Freq: Two times a day (BID) | ORAL | Status: DC | PRN

## 2020-11-03 MED ORDER — ONDANSETRON HCL 4 MG/2ML IJ SOLN
INTRAMUSCULAR | Status: DC | PRN
  Administered 2020-11-03: 4 mg via INTRAVENOUS

## 2020-11-03 MED ORDER — DIPHENHYDRAMINE HCL 25 MG PO CAPS: 25.0000 mg | ORAL_CAPSULE | Freq: Four times a day (QID) | ORAL | Status: DC | PRN

## 2020-11-03 MED ORDER — METRONIDAZOLE 500 MG PO TABS
500.0000 mg | ORAL_TABLET | Freq: Two times a day (BID) | ORAL | Status: DC
  Administered 2020-11-03 – 2020-11-04 (×2): 500 mg via ORAL
  Filled 2020-11-03 (×2): qty 1

## 2020-11-03 MED ORDER — ACETAMINOPHEN 500 MG PO TABS
1000.0000 mg | ORAL_TABLET | Freq: Four times a day (QID) | ORAL | Status: DC
  Administered 2020-11-03 – 2020-11-04 (×2): 1000 mg via ORAL
  Filled 2020-11-03 (×2): qty 2

## 2020-11-03 MED ORDER — HYDROMORPHONE HCL 1 MG/ML IJ SOLN
0.5000 mg | Freq: Once | INTRAMUSCULAR | Status: AC
Start: 2020-11-03 — End: 2020-11-03
  Administered 2020-11-03: 0.5 mg via INTRAVENOUS
  Filled 2020-11-03: qty 1

## 2020-11-03 MED ORDER — DIPHENHYDRAMINE HCL 50 MG/ML IJ SOLN: 25.0000 mg | Freq: Four times a day (QID) | INTRAMUSCULAR | Status: DC | PRN

## 2020-11-03 MED ORDER — ONDANSETRON HCL 4 MG/2ML IJ SOLN: 4.0000 mg | Freq: Once | INTRAMUSCULAR | Status: AC | PRN

## 2020-11-03 MED ORDER — ONDANSETRON HCL 4 MG/2ML IJ SOLN
INTRAMUSCULAR | Status: AC
  Filled 2020-11-03: qty 2

## 2020-11-03 MED ORDER — FENTANYL CITRATE (PF) 100 MCG/2ML IJ SOLN
INTRAMUSCULAR | Status: AC
  Filled 2020-11-03: qty 2

## 2020-11-03 MED ORDER — PROPOFOL 10 MG/ML IV BOLUS
INTRAVENOUS | Status: AC
  Filled 2020-11-03: qty 20

## 2020-11-03 MED ORDER — ROCURONIUM BROMIDE 10 MG/ML (PF) SYRINGE
PREFILLED_SYRINGE | INTRAVENOUS | Status: DC | PRN
Start: 2020-11-03 — End: 2020-11-03
  Administered 2020-11-03 (×2): 10 mg via INTRAVENOUS
  Administered 2020-11-03: 30 mg via INTRAVENOUS

## 2020-11-03 MED ORDER — DOXYCYCLINE HYCLATE 100 MG PO CAPS: 100.0000 mg | ORAL_CAPSULE | Freq: Two times a day (BID) | ORAL | 0 refills | Status: AC

## 2020-11-03 MED ORDER — FENTANYL CITRATE PF 50 MCG/ML IJ SOSY
25.0000 ug | PREFILLED_SYRINGE | INTRAMUSCULAR | Status: DC | PRN
  Administered 2020-11-03: 50 ug via INTRAVENOUS

## 2020-11-03 MED ORDER — HYDROMORPHONE HCL 1 MG/ML IJ SOLN
0.5000 mg | INTRAMUSCULAR | Status: DC | PRN
Start: 2020-11-03 — End: 2020-11-04
  Administered 2020-11-03: 0.5 mg via INTRAVENOUS
  Filled 2020-11-03: qty 0.5

## 2020-11-03 MED ORDER — MELATONIN 3 MG PO TABS: 3.0000 mg | ORAL_TABLET | Freq: Every evening | ORAL | Status: DC | PRN

## 2020-11-03 MED ORDER — METRONIDAZOLE 500 MG/100ML IV SOLN
500.0000 mg | Freq: Once | INTRAVENOUS | Status: AC
  Administered 2020-11-03: 500 mg via INTRAVENOUS
  Filled 2020-11-03: qty 100

## 2020-11-03 MED ORDER — ROCURONIUM BROMIDE 10 MG/ML (PF) SYRINGE
PREFILLED_SYRINGE | INTRAVENOUS | Status: AC
  Filled 2020-11-03: qty 10

## 2020-11-03 MED ORDER — METRONIDAZOLE 500 MG PO TABS: 500.0000 mg | ORAL_TABLET | Freq: Two times a day (BID) | ORAL | 0 refills | Status: AC

## 2020-11-03 MED ORDER — LIDOCAINE HCL (PF) 2 % IJ SOLN
INTRAMUSCULAR | Status: AC
  Filled 2020-11-03: qty 5

## 2020-11-03 MED ORDER — PROMETHAZINE HCL 25 MG/ML IJ SOLN: 6.2500 mg | INTRAMUSCULAR | Status: DC | PRN

## 2020-11-03 MED ORDER — ACETAMINOPHEN 325 MG PO TABS: 325.0000 mg | ORAL_TABLET | ORAL | Status: DC | PRN

## 2020-11-03 MED ORDER — ENOXAPARIN SODIUM 40 MG/0.4ML IJ SOSY: 40.0000 mg | PREFILLED_SYRINGE | INTRAMUSCULAR | Status: DC

## 2020-11-03 MED ORDER — SUCCINYLCHOLINE CHLORIDE 200 MG/10ML IV SOSY
PREFILLED_SYRINGE | INTRAVENOUS | Status: DC | PRN
  Administered 2020-11-03: 150 mg via INTRAVENOUS

## 2020-11-03 MED ORDER — ONDANSETRON HCL 4 MG/2ML IJ SOLN: 4.0000 mg | Freq: Four times a day (QID) | INTRAMUSCULAR | Status: DC | PRN

## 2020-11-03 MED ORDER — IOHEXOL 350 MG/ML SOLN
85.0000 mL | Freq: Once | INTRAVENOUS | Status: AC | PRN
  Administered 2020-11-03: 85 mL via INTRAVENOUS

## 2020-11-03 SURGICAL SUPPLY — 51 items
APPLICATOR ARISTA FLEXITIP XL (MISCELLANEOUS) IMPLANT
APPLIER CLIP 5 13 M/L LIGAMAX5 (MISCELLANEOUS)
APPLIER CLIP ROT 10 11.4 M/L (STAPLE)
BAG COUNTER SPONGE SURGICOUNT (BAG) IMPLANT
BENZOIN TINCTURE PRP APPL 2/3 (GAUZE/BANDAGES/DRESSINGS) IMPLANT
BNDG ADH 1X3 SHEER STRL LF (GAUZE/BANDAGES/DRESSINGS) ×8 IMPLANT
CABLE HIGH FREQUENCY MONO STRZ (ELECTRODE) ×2 IMPLANT
CHLORAPREP W/TINT 26 (MISCELLANEOUS) ×2 IMPLANT
CLIP APPLIE 5 13 M/L LIGAMAX5 (MISCELLANEOUS) IMPLANT
CLIP APPLIE ROT 10 11.4 M/L (STAPLE) IMPLANT
CLIP LIGATING HEMO O LOK GREEN (MISCELLANEOUS) IMPLANT
COVER MAYO STAND STRL (DRAPES) IMPLANT
COVER SURGICAL LIGHT HANDLE (MISCELLANEOUS) ×2 IMPLANT
DECANTER SPIKE VIAL GLASS SM (MISCELLANEOUS) ×2 IMPLANT
DERMABOND ADVANCED (GAUZE/BANDAGES/DRESSINGS) ×1
DERMABOND ADVANCED .7 DNX12 (GAUZE/BANDAGES/DRESSINGS) ×1 IMPLANT
DRAPE C-ARM 42X120 X-RAY (DRAPES) IMPLANT
DRSG TEGADERM 2-3/8X2-3/4 SM (GAUZE/BANDAGES/DRESSINGS) IMPLANT
ELECT REM PT RETURN 15FT ADLT (MISCELLANEOUS) ×2 IMPLANT
GAUZE SPONGE 2X2 8PLY STRL LF (GAUZE/BANDAGES/DRESSINGS) IMPLANT
GLOVE SRG 8 PF TXTR STRL LF DI (GLOVE) ×1 IMPLANT
GLOVE SURG MICRO LTX SZ7.5 (GLOVE) ×2 IMPLANT
GLOVE SURG UNDER POLY LF SZ8 (GLOVE) ×1
GOWN STRL REUS W/TWL XL LVL3 (GOWN DISPOSABLE) ×6 IMPLANT
GRASPER SUT TROCAR 14GX15 (MISCELLANEOUS) ×2 IMPLANT
HEMOSTAT ARISTA ABSORB 3G PWDR (HEMOSTASIS) IMPLANT
HEMOSTAT SNOW SURGICEL 2X4 (HEMOSTASIS) ×2 IMPLANT
KIT BASIN OR (CUSTOM PROCEDURE TRAY) ×2 IMPLANT
KIT TURNOVER KIT A (KITS) ×2 IMPLANT
L-HOOK LAP DISP 36CM (ELECTROSURGICAL)
LHOOK LAP DISP 36CM (ELECTROSURGICAL) IMPLANT
POUCH RETRIEVAL ECOSAC 10 (ENDOMECHANICALS) ×1 IMPLANT
POUCH RETRIEVAL ECOSAC 10MM (ENDOMECHANICALS) ×1
SCISSORS LAP 5X35 DISP (ENDOMECHANICALS) ×2 IMPLANT
SET CHOLANGIOGRAPH MIX (MISCELLANEOUS) IMPLANT
SET IRRIG TUBING LAPAROSCOPIC (IRRIGATION / IRRIGATOR) ×2 IMPLANT
SET TUBE SMOKE EVAC HIGH FLOW (TUBING) ×2 IMPLANT
SLEEVE XCEL OPT CAN 5 100 (ENDOMECHANICALS) ×4 IMPLANT
SPONGE GAUZE 2X2 STER 10/PKG (GAUZE/BANDAGES/DRESSINGS)
STRIP CLOSURE SKIN 1/2X4 (GAUZE/BANDAGES/DRESSINGS) IMPLANT
SUT MNCRL AB 4-0 PS2 18 (SUTURE) ×2 IMPLANT
SUT VIC AB 0 UR5 27 (SUTURE) IMPLANT
SUT VICRYL 0 TIES 12 18 (SUTURE) IMPLANT
SUT VICRYL 0 UR6 27IN ABS (SUTURE) IMPLANT
TOWEL OR 17X26 10 PK STRL BLUE (TOWEL DISPOSABLE) ×2 IMPLANT
TOWEL OR NON WOVEN STRL DISP B (DISPOSABLE) ×2 IMPLANT
TRAY LAPAROSCOPIC (CUSTOM PROCEDURE TRAY) ×2 IMPLANT
TROCAR BLADELESS OPT 12M 100M (ENDOMECHANICALS) ×2 IMPLANT
TROCAR BLADELESS OPT 5 100 (ENDOMECHANICALS) ×2 IMPLANT
TROCAR XCEL BLUNT TIP 100MML (ENDOMECHANICALS) IMPLANT
TROCAR XCEL NON-BLD 11X100MML (ENDOMECHANICALS) IMPLANT

## 2020-11-03 NOTE — ED Provider Notes (Signed)
Signout note  50 year old with abdominal pain, back pain.  CT scan performed concerning for cholelithiasis but without evidence for cholecystitis.  Pain had improved but required additional dose of Dilaudid.  UA concerning for trichomonas.  Started on empiric treatment for gonorrhea, chlamydia and trichomonas.  Ultrasound ordered to better evaluate for gallbladder pathology.  If ultrasound reassuring, likely discharge.  7:27 AM received signout from [place], f/u on [place]  9:27 AM ultrasound concerning for acute cholecystitis.  Updated patient, pain is currently controlled.  Will consult general surgery. Pt has already received dose of ceftriaxone.   Milagros Loll, MD 11/03/20 [phone]

## 2020-11-03 NOTE — ED Provider Notes (Signed)
[facility] Emergency Department Provider Note MRN:  [id number]  Arrival date & time: 11/03/20     Chief Complaint   Abdominal Pain and Emesis   History of Present Illness   Kaitlyn Ashley is a 50 y.o. year-old female with a history of hypertension, cardiomyopathy, thalassemia presenting to the ED with chief complaint of abdominal pain.  Location: Diffuse Duration: 1 day Onset: Sudden Timing: Constant Description: Sharp Severity: Severe Exacerbating/Alleviating Factors: None, unrelenting Associated Symptoms: Started with back pain 3 days ago, also having multiple rounds of nonbloody nonbilious emesis, also endorsing hematuria and dysuria Pertinent Negatives: No fever, no chest pain or shortness of breath  Additional History: None  Review of Systems  A complete 10 system review of systems was obtained and all systems are negative except as noted in the HPI and PMH.   Patient's Health History    Past Medical History:  Diagnosis Date   Anemia    Cardiomyopathy (HCC)    Hypertension    Migraines    Morbid obesity with BMI of 40.0-44.9, adult (HCC)    Thalassemia     Past Surgical History:  Procedure Laterality Date   ABDOMINAL HYSTERECTOMY     partial   BREAST REDUCTION SURGERY     CESAREAN SECTION     x2   TUBAL LIGATION      Family History  Problem Relation Age of Onset   Hypertension Mother    Hypertension Father     Social History   Socioeconomic History   Marital status: Married    Spouse name: Not on file   Number of children: Not on file   Years of education: Not on file   Highest education level: Not on file  Occupational History   Not on file  Tobacco Use   Smoking status: Never   Smokeless tobacco: Never  Vaping Use   Vaping Use: Never used  Substance and Sexual Activity   Alcohol use: Yes    Comment: rare   Drug use: Never   Sexual activity: Not on file    Comment: last new partner was in 2019  Other Topics  Concern   Not on file  Social History Narrative   Lives with husband   Social Determinants of Health   Financial Resource Strain: Not on file  Food Insecurity: Not on file  Transportation Needs: Not on file  Physical Activity: Not on file  Stress: Not on file  Social Connections: Not on file  Intimate Partner Violence: Not on file     Physical Exam   Vitals:   11/03/20 0200 11/03/20 0500  BP: (!) 132/98 133/88  Pulse: 68 66  Resp: 17 16  Temp: 98.4 F (36.9 C) 98.4 F (36.9 C)  SpO2: 99% 99%    CONSTITUTIONAL: Well-appearing, NAD NEURO:  Alert and oriented x 3, no focal deficits EYES:  eyes equal and reactive ENT/NECK:  no LAD, no JVD CARDIO: Regular rate, well-perfused, normal S1 and S2 PULM:  CTAB no wheezing or rhonchi GI/GU:  normal bowel sounds, non-distended, diffuse abdominal tenderness MSK/SPINE:  No gross deformities, no edema SKIN:  no rash, atraumatic PSYCH:  Appropriate speech and behavior  *Additional and/or pertinent findings included in MDM below  Diagnostic and Interventional Summary    EKG Interpretation  Date/Time:    Ventricular Rate:    PR Interval:    QRS Duration:   QT Interval:    QTC Calculation:   R Axis:  Text Interpretation:         Labs Reviewed  COMPREHENSIVE METABOLIC PANEL - Abnormal; Notable for the following components:      Result Value   Glucose, Bld 117 (*)    Creatinine, Ser 1.22 (*)    Total Protein 8.2 (*)    GFR, Estimated 55 (*)    All other components within normal limits  CBC - Abnormal; Notable for the following components:   RBC 5.49 (*)    MCV 77.8 (*)    MCH 24.4 (*)    All other components within normal limits  LIPASE, BLOOD - Abnormal; Notable for the following components:   Lipase 64 (*)    All other components within normal limits  URINALYSIS, ROUTINE W REFLEX MICROSCOPIC - Abnormal; Notable for the following components:   Hgb urine dipstick SMALL (*)    Leukocytes,Ua SMALL (*)    All  other components within normal limits  URINALYSIS, MICROSCOPIC (REFLEX) - Abnormal; Notable for the following components:   Bacteria, UA FEW (*)    Trichomonas, UA PRESENT (*)    All other components within normal limits  GC/CHLAMYDIA PROBE AMP ([place]) NOT AT [facility]    CT ABDOMEN PELVIS W CONTRAST  Final Result    US Abdomen Limited RUQ (LIVER/GB)    (Results Pending)    Medications  metroNIDAZOLE (FLAGYL) IVPB 500 mg (500 mg Intravenous New Bag/Given 11/03/20 0655)  ondansetron (ZOFRAN) injection 4 mg (4 mg Intravenous Given 11/03/20 0138)  sodium chloride 0.9 % bolus 1,000 mL ( Intravenous Stopped 11/03/20 0343)  HYDROmorphone (DILAUDID) injection 1 mg (1 mg Intravenous Given 11/03/20 0203)  iohexol (OMNIPAQUE) 350 MG/ML injection 85 mL (85 mLs Intravenous Contrast Given 11/03/20 0225)  cefTRIAXone (ROCEPHIN) 2 g in sodium chloride 0.9 % 100 mL IVPB (0 g Intravenous Stopped 11/03/20 0650)  HYDROmorphone (DILAUDID) injection 0.5 mg (0.5 mg Intravenous Given 11/03/20 0618)     Procedures  /  Critical Care Procedures  ED Course and Medical Decision Making  I have reviewed the triage vital signs, the nursing notes, and pertinent available records from the EMR.  Listed above are laboratory and imaging tests that I personally ordered, reviewed, and interpreted and then considered in my medical decision making (see below for details).  Concern for kidney stone, pyelonephritis, perforated viscus, aortic pathology.  Vital signs reassuring, diffuse tenderness, awaiting CT.     CT reveals cholelithiasis without obvious cholecystitis.  Patient continues to have pain, had some relief with Dilaudid but pain is coming back.  Will obtain right upper quadrant ultrasound to further delineate any signs of cholecystitis or stone in the gallbladder neck.  Work-up otherwise reassuring, urinalysis is positive for trichomonas.  Patient denies any sexual activity over the past 9 months which is difficult to  explain.  Patient denies vaginal discharge or bleeding, no vaginal pain.  She defers pelvic exam at this time, was just examined with Pap smear 2 months ago, has close follow-up with GYN in a few weeks.  We will treat for trichomonas and empirically treat for gonorrhea and chlamydia.  If ultrasound reassuring and patient feeling well would be a candidate for discharge.  Signed out to oncoming provider at shift change  Elmer Sow. Pilar Plate, MD [facility] [email or web address]  Final Clinical Impressions(s) / ED Diagnoses     ICD-10-CM   1. Acute back pain, unspecified back location, unspecified back pain laterality  M54.9     2. RUQ abdominal  pain  R10.11 US Abdomen Limited RUQ (LIVER/GB)    US Abdomen Limited RUQ (LIVER/GB)    3. Trichomonal urethritis  A59.03       ED Discharge Orders          Ordered    doxycycline (VIBRAMYCIN) 100 MG capsule  2 times daily        11/03/20 0544    metroNIDAZOLE (FLAGYL) 500 MG tablet  2 times daily        11/03/20 0544             Discharge Instructions Discussed with and Provided to Patient:   Discharge Instructions   None       Sabas Sous, MD 11/03/20 [phone]

## 2020-11-03 NOTE — Discharge Instructions (Signed)
[facility] ° °Please arrive at least 30 min before your appointment to complete your check in paperwork.  If you are unable to arrive 30 min prior to your appointment time we may have to cancel or reschedule you. °LAPAROSCOPIC SURGERY: POST OP INSTRUCTIONS °Always review your discharge instruction sheet given to you by the facility where your surgery was performed. °IF YOU HAVE DISABILITY OR FAMILY LEAVE FORMS, YOU MUST BRING THEM TO THE OFFICE FOR PROCESSING.   °DO NOT GIVE THEM TO YOUR DOCTOR. ° °PAIN CONTROL ° °First take acetaminophen (Tylenol) AND/or ibuprofen (Advil) to control your pain after surgery.  Follow directions on package.  Taking acetaminophen (Tylenol) and/or ibuprofen (Advil) regularly after surgery will help to control your pain and lower the amount of prescription pain medication you may need.  You should not take more than 4,000 mg (4 grams) of acetaminophen (Tylenol) in 24 hours.  You should not take ibuprofen (Advil), aleve, motrin, naprosyn or other NSAIDS if you have a history of stomach ulcers or chronic kidney disease.  °A prescription for pain medication may be given to you upon discharge.  Take your pain medication as prescribed, if you still have uncontrolled pain after taking acetaminophen (Tylenol) or ibuprofen (Advil). °Use ice packs to help control pain. °If you need a refill on your pain medication, please contact your pharmacy.  They will contact our office to request authorization. Prescriptions will not be filled after 5pm or on week-ends. ° °HOME MEDICATIONS °Take your usually prescribed medications unless otherwise directed. ° °DIET °You should follow a light diet the first few days after arrival home.  Be sure to include lots of fluids daily. Avoid fatty, fried foods.  ° °CONSTIPATION °It is common to experience some constipation after surgery and if you are taking pain medication.  Increasing fluid intake and taking a stool softener (such as Colace)  will usually help or prevent this problem from occurring.  A mild laxative (Milk of Magnesia or Miralax) should be taken according to package instructions if there are no bowel movements after 48 hours. ° °WOUND/INCISION CARE °Most patients will experience some swelling and bruising in the area of the incisions.  Ice packs will help.  Swelling and bruising can take several days to resolve.  °Unless discharge instructions indicate otherwise, follow guidelines below  °STERI-STRIPS - you may remove your outer bandages 48 hours after surgery, and you may shower at that time.  You have steri-strips (small skin tapes) in place directly over the incision.  These strips should be left on the skin for 7-10 days.   °DERMABOND/SKIN GLUE - you may shower in 24 hours.  The glue will flake off over the next 2-3 weeks. °Any sutures or staples will be removed at the office during your follow-up visit. ° °ACTIVITIES °You may resume regular (light) daily activities beginning the next day--such as daily self-care, walking, climbing stairs--gradually increasing activities as tolerated.  You may have sexual intercourse when it is comfortable.  Refrain from any heavy lifting or straining until approved by your doctor. °You may drive when you are no longer taking prescription pain medication, you can comfortably wear a seatbelt, and you can safely maneuver your car and apply brakes. ° °FOLLOW-UP °You should see your doctor in the office for a follow-up appointment approximately 2-3 weeks after your surgery.  You should have been given your post-op/follow-up appointment when your surgery was scheduled.  If you did not receive a post-op/follow-up appointment, make sure   that you call for this appointment within a day or two after you arrive home to insure a convenient appointment time. ° ° °WHEN TO CALL YOUR DOCTOR: °Fever over 101.0 °Inability to urinate °Continued bleeding from incision. °Increased pain, redness, or drainage from the  incision. °Increasing abdominal pain ° °The clinic staff is available to answer your questions during regular business hours.  Please don’t hesitate to call and ask to speak to one of the nurses for clinical concerns.  If you have a medical emergency, go to the nearest emergency room or call 911.  A surgeon from [facility] is always on call at the hospital. °[address], [address], [place], [place]  [address] ? [address] [address], [place], [place]   [address] °[phone] ? [phone] ? FAX [phone] ° ° ° ° °Managing Your Pain After Surgery Without Opioids ° ° ° °Thank you for participating in our program to help patients manage their pain after surgery without opioids. This is part of our effort to provide you with the best care possible, without exposing you or your family to the risk that opioids pose. ° °What pain can I expect after surgery? °You can expect to have some pain after surgery. This is normal. The pain is typically worse the day after surgery, and quickly begins to get better. °Many studies have found that many patients are able to manage their pain after surgery with Over-the-Counter (OTC) medications such as Tylenol and Motrin. If you have a condition that does not allow you to take Tylenol or Motrin, notify your surgical team. ° °How will I manage my pain? °The best strategy for controlling your pain after surgery is around the clock pain control with Tylenol (acetaminophen) and Motrin (ibuprofen or Advil). Alternating these medications with each other allows you to maximize your pain control. In addition to Tylenol and Motrin, you can use heating pads or ice packs on your incisions to help reduce your pain. ° °How will I alternate your regular strength over-the-counter pain medication? °You will take a dose of pain medication every three hours. °Start by taking 650 mg of Tylenol (2 pills of 325 mg) °3 hours later take 600 mg of Motrin (3 pills of 200 mg) °3 hours after  taking the Motrin take 650 mg of Tylenol °3 hours after that take 600 mg of Motrin. ° ° °- 1 - ° °See example - if your first dose of Tylenol is at 12:00 PM ° ° °12:00 PM Tylenol 650 mg (2 pills of 325 mg)  °3:00 PM Motrin 600 mg (3 pills of 200 mg)  °6:00 PM Tylenol 650 mg (2 pills of 325 mg)  °9:00 PM Motrin 600 mg (3 pills of 200 mg)  °Continue alternating every 3 hours  ° °We recommend that you follow this schedule around-the-clock for at least 3 days after surgery, or until you feel that it is no longer needed. Use the table on the last page of this handout to keep track of the medications you are taking. °Important: °Do not take more than 3000mg of Tylenol or 3200mg of Motrin in a 24-hour period. °Do not take ibuprofen/Motrin if you have a history of bleeding stomach ulcers, severe kidney disease, &/or actively taking a blood thinner ° °What if I still have pain? °If you have pain that is not controlled with the over-the-counter pain medications (Tylenol and Motrin or Advil) you might have what we call “breakthrough” pain. You will receive a prescription   for a small amount of an opioid pain medication such as Oxycodone, Tramadol, or Tylenol with Codeine. Use these opioid pills in the first 24 hours after surgery if you have breakthrough pain. Do not take more than 1 pill every 4-6 hours. ° °If you still have uncontrolled pain after using all opioid pills, don't hesitate to call our staff using the number provided. We will help make sure you are managing your pain in the best way possible, and if necessary, we can provide a prescription for additional pain medication. ° ° °Day 1   ° °Time  °Name of Medication Number of pills taken  °Amount of Acetaminophen  °Pain Level  ° °Comments  °AM PM       °AM PM       °AM PM       °AM PM       °AM PM       °AM PM       °AM PM       °AM PM       °Total Daily amount of Acetaminophen °Do not take more than  3,000 mg per day    ° ° °Day 2   ° °Time  °Name of Medication  Number of pills °taken  °Amount of Acetaminophen  °Pain Level  ° °Comments  °AM PM       °AM PM       °AM PM       °AM PM       °AM PM       °AM PM       °AM PM       °AM PM       °Total Daily amount of Acetaminophen °Do not take more than  3,000 mg per day    ° ° °Day 3   ° °Time  °Name of Medication Number of pills taken  °Amount of Acetaminophen  °Pain Level  ° °Comments  °AM PM       °AM PM       °AM PM       °AM PM       ° ° ° °AM PM       °AM PM       °AM PM       °AM PM       °Total Daily amount of Acetaminophen °Do not take more than  3,000 mg per day    ° ° °Day 4   ° °Time  °Name of Medication Number of pills taken  °Amount of Acetaminophen  °Pain Level  ° °Comments  °AM PM       °AM PM       °AM PM       °AM PM       °AM PM       °AM PM       °AM PM       °AM PM       °Total Daily amount of Acetaminophen °Do not take more than  3,000 mg per day    ° ° °Day 5   ° °Time  °Name of Medication Number °of pills taken  °Amount of Acetaminophen  °Pain Level  ° °Comments  °AM PM       °AM PM       °AM PM       °AM PM       °AM PM       °AM   PM       °AM PM       °AM PM       °Total Daily amount of Acetaminophen °Do not take more than  3,000 mg per day    ° ° ° °Day 6   ° °Time  °Name of Medication Number of pills °taken  °Amount of Acetaminophen  °Pain Level  °Comments  °AM PM       °AM PM       °AM PM       °AM PM       °AM PM       °AM PM       °AM PM       °AM PM       °Total Daily amount of Acetaminophen °Do not take more than  3,000 mg per day    ° ° °Day 7   ° °Time  °Name of Medication Number of pills taken  °Amount of Acetaminophen  °Pain Level  ° °Comments  °AM PM       °AM PM       °AM PM       °AM PM       °AM PM       °AM PM       °AM PM       °AM PM       °Total Daily amount of Acetaminophen °Do not take more than  3,000 mg per day    ° ° ° ° °For additional information about how and where to safely dispose of unused opioid °medications - https://www.morepowerfulnc.org ° °Disclaimer: This document  contains information and/or instructional materials adapted from [facility] for the typical patient with your condition. It does not replace medical advice from your health care provider because your experience may differ from that of the °typical patient. Talk to your health care provider if you have any questions about this °document, your condition or your treatment plan. °Adapted from [facility] ° °

## 2020-11-03 NOTE — Op Note (Signed)
ARAYAH KROUSE [id number] 1971/01/16 11/03/2020  Laparoscopic Cholecystectomy  Procedure Note  Indications: This patient presents with symptomatic gallbladder disease and will undergo laparoscopic cholecystectomy.  Pre-operative Diagnosis: Calculus of gallbladder with acute cholecystitis, without mention of obstruction  Post-operative Diagnosis: Same + intra-abdominal adhesions, fatty liver  Surgeon: Gaynelle Adu MD FACS  Assistants: Barnetta Chapel PA-C  Anesthesia: General endotracheal anesthesia   Procedure Details  The patient was seen again in the Holding Room. The risks, benefits, complications, treatment options, and expected outcomes were discussed with the patient. The possibilities of reaction to medication, pulmonary aspiration, perforation of viscus, bleeding, recurrent infection, finding a normal gallbladder, the need for additional procedures, failure to diagnose a condition, the possible need to convert to an open procedure, and creating a complication requiring transfusion or operation were discussed with the patient. The likelihood of improving the patient's symptoms with return to their baseline status is good.  The patient and/or family concurred with the proposed plan, giving informed consent. The site of surgery properly noted. The patient was taken to Operating Room, identified as Karren Burly and the procedure verified as Laparoscopic Cholecystectomy.  A Time Out was held and the above information confirmed. Antibiotic prophylaxis was administered.   Prior to the induction of general anesthesia, antibiotic prophylaxis was administered. General endotracheal anesthesia was then administered and tolerated well. After the induction, the abdomen was prepped with Chloraprep and draped in the sterile fashion. The patient was positioned in the supine position.  Local anesthetic agent was injected into the skin near the umbilicus and an incision made. We dissected down to the  abdominal fascia with blunt dissection.  The fascia was incised vertically and we entered the peritoneal cavity bluntly.  A pursestring suture of 0-Vicryl was placed around the fascial opening.  The Hasson cannula was inserted and secured with the stay suture.  Pneumoperitoneum was then created with CO2 and tolerated well without any adverse changes in the patient's vital signs.  There appeared to be an omental adhesion when I inserted the laparoscope through the umbilicus.  I was able to get through this omental adhesion an 5-mm port was placed in the subxiphoid position.  Two 5-mm ports were placed in the right upper quadrant. All skin incisions were infiltrated with a local anesthetic agent before making the incision and placing the trocars.  I placed the laparoscope in the subxiphoid port and visualize the umbilical area.  There were omental adhesions around the umbilicus extending down the lower midline.  I had not injured any viscera.  We were able to lift off some of the omentum from the [facility] trocar.  We positioned the patient in reverse Trendelenburg, tilted slightly to the patient's left.  The gallbladder was identified, the fundus grasped and retracted cephalad.  Patient had a fatty liver.  Adhesions were lysed bluntly and with the electrocautery where indicated, taking care not to injure any adjacent organs or viscus. The infundibulum was grasped and retracted laterally, exposing the peritoneum overlying the triangle of Calot. This was then divided and exposed in a blunt fashion. A critical view of the cystic duct and cystic artery was obtained.  The cystic duct was clearly identified and bluntly dissected circumferentially.  No other structures were entering the gallbladder.  The cystic duct was then ligated with clips and divided. The cystic artery which had been identified & dissected free was ligated with clips and divided as well.   The gallbladder was dissected from the liver bed in  retrograde fashion with the electrocautery.  There was a large amount of edema in the gallbladder wall.  There was some bleeding from the gallbladder fossa as I was mobilizing the gallbladder away from the liver surface.  The gallbladder was removed and placed in an Ecco sac.  The gallbladder and Ecco sac were then removed through the umbilical port site. The liver bed was irrigated and inspected. Hemostasis was achieved with the electrocautery. Copious irrigation was utilized and was repeatedly aspirated until clear.  I did elect to place a piece of surgical snow in the gallbladder fossa.  The pursestring suture was used to close the umbilical fascia.  An additional interrupted 0 Vicryl was placed at the umbilical fascia with a PMI using laparoscopic guidance.  Additional local was infiltrated in this area.  We again inspected the right upper quadrant for hemostasis.  The umbilical closure was inspected and there was no air leak and nothing trapped within the closure. Pneumoperitoneum was released as we removed the trocars.  4-0 Monocryl was used to close the skin.   Dermabond was applied. The patient was then extubated and brought to the recovery room in stable condition. Instrument, sponge, and needle counts were correct at closure and at the conclusion of the case.   Findings: Acute Cholecystitis with Cholelithiasis +critical view +snow  Estimated Blood Loss:  25 ml         Drains: none         Specimens: Gallbladder           Complications: None; patient tolerated the procedure well.         Disposition: PACU - hemodynamically stable.         Condition: stable  Mary Sella. Andrey Campanile, MD, FACS General, Bariatric, & Minimally Invasive Surgery [facility] [place]

## 2020-11-03 NOTE — Anesthesia Postprocedure Evaluation (Signed)
Anesthesia Post Note  Patient: Kaitlyn Ashley  Procedure(s) Performed: LAPAROSCOPIC CHOLECYSTECTOMY (Abdomen)     Patient location during evaluation: PACU Anesthesia Type: General Level of consciousness: awake and alert Pain management: pain level controlled Vital Signs Assessment: post-procedure vital signs reviewed and stable Respiratory status: spontaneous breathing, nonlabored ventilation, respiratory function stable and patient connected to nasal cannula oxygen Cardiovascular status: blood pressure returned to baseline and stable Postop Assessment: no apparent nausea or vomiting Anesthetic complications: no   No notable events documented.  Last Vitals:  Vitals:   11/03/20 1645 11/03/20 1712  BP: 132/80 (!) 160/90  Pulse: (!) 56 61  Resp: 18 16  Temp: 36.7 C 36.7 C  SpO2: 100% 96%    Last Pain:  Vitals:   11/03/20 1645  TempSrc:   PainSc: 2                  Shelton Silvas

## 2020-11-03 NOTE — Anesthesia Procedure Notes (Addendum)
Procedure Name: Intubation Date/Time: 11/03/2020 2:37 PM Performed by: Ezekiel Ina, CRNA Pre-anesthesia Checklist: Patient identified, Emergency Drugs available, Suction available and Patient being monitored Patient Re-evaluated:Patient Re-evaluated prior to induction Oxygen Delivery Method: Circle system utilized Preoxygenation: Pre-oxygenation with 100% oxygen Induction Type: IV induction, Rapid sequence and Cricoid Pressure applied Laryngoscope Size: Miller and 2 Grade View: Grade I Tube type: Oral Tube size: 7.5 mm Number of attempts: 1 Airway Equipment and Method: Stylet Placement Confirmation: ETT inserted through vocal cords under direct vision, positive ETCO2 and breath sounds checked- equal and bilateral Secured at: 20 cm Tube secured with: Tape Dental Injury: Teeth and Oropharynx as per pre-operative assessment  Comments: Small mouth opening but grade 1 view with Mil2

## 2020-11-03 NOTE — ED Provider Notes (Signed)
Patient presents as a transfer from med [facility].  She had findings of cholecystitis on ultrasound.  She received a dose of Rocephin.  We will notify surgery that the patient is here.  Her pain is relatively well controlled at this point.   Rolan Bucco, MD 11/03/20 [phone]

## 2020-11-03 NOTE — ED Notes (Signed)
Report given to [facility] 

## 2020-11-03 NOTE — Transfer of Care (Signed)
Immediate Anesthesia Transfer of Care Note  Patient: Kaitlyn Ashley  Procedure(s) Performed: LAPAROSCOPIC CHOLECYSTECTOMY (Abdomen)  Patient Location: PACU  Anesthesia Type:General  Level of Consciousness: drowsy  Airway & Oxygen Therapy: Patient Spontanous Breathing and Patient connected to face mask oxygen  Post-op Assessment: Report given to RN and Post -op Vital signs reviewed and stable  Post vital signs: Reviewed and stable  Last Vitals:  Vitals Value Taken Time  BP 130/98 11/03/20 1600  Temp    Pulse 77 11/03/20 1603  Resp 27 11/03/20 1603  SpO2 100 % 11/03/20 1603  Vitals shown include unvalidated device data.  Last Pain:  Vitals:   11/03/20 1342  TempSrc:   PainSc: 5       Patients Stated Pain Goal: 4 (11/03/20 1342)  Complications: No notable events documented.

## 2020-11-03 NOTE — Anesthesia Preprocedure Evaluation (Addendum)
Anesthesia Evaluation  Patient identified by MRN, date of birth, ID band Patient awake    Reviewed: Allergy & Precautions, NPO status , Patient's Chart, lab work & pertinent test results, reviewed documented beta blocker date and time   Airway Mallampati: III  TM Distance: >3 FB Neck ROM: Full    Dental  (+) Teeth Intact, Missing,    Pulmonary neg pulmonary ROS,    breath sounds clear to auscultation       Cardiovascular hypertension, Pt. on medications and Pt. on home beta blockers  Rhythm:Regular Rate:Normal     Neuro/Psych  Headaches, negative psych ROS   GI/Hepatic negative GI ROS, Neg liver ROS,   Endo/Other  negative endocrine ROS  Renal/GU negative Renal ROS     Musculoskeletal negative musculoskeletal ROS (+)   Abdominal (+) + obese,   Peds  Hematology   Anesthesia Other Findings   Reproductive/Obstetrics                            Anesthesia Physical Anesthesia Plan  ASA: 2  Anesthesia Plan: General   Post-op Pain Management:    Induction: Intravenous  PONV Risk Score and Plan: 4 or greater and Ondansetron, Dexamethasone, Midazolam and Scopolamine patch - Pre-op  Airway Management Planned: Oral ETT  Additional Equipment: None  Intra-op Plan:   Post-operative Plan: Extubation in OR  Informed Consent: I have reviewed the patients History and Physical, chart, labs and discussed the procedure including the risks, benefits and alternatives for the proposed anesthesia with the patient or authorized representative who has indicated his/her understanding and acceptance.     Dental advisory given  Plan Discussed with: CRNA  Anesthesia Plan Comments:        Anesthesia Quick Evaluation

## 2020-11-03 NOTE — H&P (Signed)
Kaitlyn Ashley 1970/11/30  [id number].    Chief Complaint/Reason for Consult: cholecystitis  HPI:  This is a 50 yo female with a history of obesity, HTN, and thalassemia who began having some upper abdominal pain about 3 days ago.  This radiates to her back .  She began having nausea and vomiting as well.  She denies any diarrhea.  She denies any fevers, chills, CP, SOB, cough, etc.  She does admit to some dysuria.  Due to persistent symptoms with no improvement, she went to [facility] for evaluation. She underwent labs with normal LFTs, lipase 64, and normal WBC.  She had an [place] that revealed cholelithiasis with gallbladder wall thickening and positive sonographic Murphy's sign.  Her UA reveals small hgb and some trichomonas present.  She was sent to [facility] for surgical evaluation.  ROS: ROS: Please see HPI, otherwise all other systems have been reviewed and are negative.  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  Family History  Problem Relation Age of Onset   Hypertension Mother    Hypertension Father     Past Medical History:  Diagnosis Date   Anemia    Cardiomyopathy (HCC)    Hypertension    Migraines    Morbid obesity with BMI of 40.0-44.9, adult (HCC)    Thalassemia     Past Surgical History:  Procedure Laterality Date   ABDOMINAL HYSTERECTOMY     partial   BREAST REDUCTION SURGERY     CESAREAN SECTION     x2   TUBAL LIGATION      Social History:  reports that she has never smoked. She has never used smokeless tobacco. She reports current alcohol use. She reports that she does not use drugs.  Allergies:  Allergies  Allergen Reactions   Latex Itching   Penicillins    Sulfa Antibiotics Itching    hives    (Not in a hospital admission)    Physical Exam: Blood pressure 128/70, pulse (!) 55, temperature 97.8 F (36.6 C), temperature source Oral, resp. rate 16, height 5\' 5"  (1.651 m),  weight 101.2 kg, SpO2 99 %. General: pleasant, obese female who is laying in bed in NAD HEENT: head is normocephalic, atraumatic.  Sclera are noninjected.  PERRL.  Ears and nose without any masses or lesions.  Mouth is pink and moist Heart: regular, rate, and rhythm.  Normal s1,s2. No obvious murmurs, gallops, or rubs noted.  Palpable radial and pedal pulses bilaterally Lungs: CTAB, no wheezes, rhonchi, or rales noted.  Respiratory effort nonlabored Abd: soft, tender in RUQ with + Murphy's sign and slight tenderness in LUQ as well, ND, +BS, no masses, hernias, or organomegaly MS: all 4 extremities are symmetrical with no cyanosis, clubbing, or edema. Skin: warm and dry with no masses, lesions, or rashes Neuro: Cranial nerves 2-12 grossly intact, sensation is normal throughout Psych: A&Ox3 with an appropriate affect.   Results for orders placed or performed during the hospital encounter of 11/03/20 (from the past 48 hour(s))  Comprehensive metabolic panel     Status: Abnormal   Collection Time: 11/03/20  1:38 AM  Result Value Ref Range   Sodium 137 135 - 145 mmol/L   Potassium 3.5 3.5 - 5.1 mmol/L   Chloride 99 98 - 111 mmol/L   CO2 30 22 - 32 mmol/L   Glucose, Bld 117 (H) 70 - 99 mg/dL    Comment: Glucose reference  range applies only to samples taken after fasting for at least 8 hours.   BUN 15 6 - 20 mg/dL   Creatinine, Ser 3.76 (H) 0.44 - 1.00 mg/dL   Calcium 9.3 8.9 - 28.3 mg/dL   Total Protein 8.2 (H) 6.5 - 8.1 g/dL   Albumin 3.8 3.5 - 5.0 g/dL   AST 16 15 - 41 U/L   ALT 15 0 - 44 U/L   Alkaline Phosphatase 74 38 - 126 U/L   Total Bilirubin 0.3 0.3 - 1.2 mg/dL   GFR, Estimated 55 (L) >60 mL/min    Comment: (NOTE) Calculated using the CKD-EPI Creatinine Equation (2021)    Anion gap 8 5 - 15    Comment: Performed at [facility], [address] [place], [place] [address]  CBC     Status: Abnormal   Collection Time: 11/03/20  1:38 AM  Result Value Ref Range   WBC  8.9 4.0 - 10.5 K/uL   RBC 5.49 (H) 3.87 - 5.11 MIL/uL   Hemoglobin 13.4 12.0 - 15.0 g/dL   HCT 16.0 73.7 - 10.6 %   MCV 77.8 (L) 80.0 - 100.0 fL   MCH 24.4 (L) 26.0 - 34.0 pg   MCHC 31.4 30.0 - 36.0 g/dL   RDW 26.9 48.5 - 46.2 %   Platelets 258 150 - 400 K/uL   nRBC 0.0 0.0 - 0.2 %    Comment: Performed at [facility], [address] [place], [place] [address]  Lipase, blood     Status: Abnormal   Collection Time: 11/03/20  1:48 AM  Result Value Ref Range   Lipase 64 (H) 11 - 51 U/L    Comment: Performed at [facility], [address] [place], [place] [address]  Urinalysis, Routine w reflex microscopic Urine, Clean Catch     Status: Abnormal   Collection Time: 11/03/20  4:01 AM  Result Value Ref Range   Color, Urine YELLOW YELLOW   APPearance CLEAR CLEAR   Specific Gravity, Urine 1.010 1.005 - 1.030   pH 7.5 5.0 - 8.0   Glucose, UA NEGATIVE NEGATIVE mg/dL   Hgb urine dipstick SMALL (A) NEGATIVE   Bilirubin Urine NEGATIVE NEGATIVE   Ketones, ur NEGATIVE NEGATIVE mg/dL   Protein, ur NEGATIVE NEGATIVE mg/dL   Nitrite NEGATIVE NEGATIVE   Leukocytes,Ua SMALL (A) NEGATIVE    Comment: Performed at [facility], [address] [place], [place] [address]  Urinalysis, Microscopic (reflex)     Status: Abnormal   Collection Time: 11/03/20  4:01 AM  Result Value Ref Range   RBC / HPF 0-5 0 - 5 RBC/hpf   WBC, UA 11-20 0 - 5 WBC/hpf   Bacteria, UA FEW (A) NONE SEEN   Squamous Epithelial / LPF 0-5 0 - 5   Trichomonas, UA PRESENT (A) NONE SEEN    Comment: Performed at [facility], [address] [place], [place] [address]  Resp Panel by RT-PCR (Flu A&B, Covid) Nasopharyngeal Swab     Status: None   Collection Time: 11/03/20  9:05 AM   Specimen: Nasopharyngeal Swab; Nasopharyngeal(NP) swabs in vial transport medium  Result Value Ref Range   SARS Coronavirus 2 by RT PCR NEGATIVE NEGATIVE    Comment: (NOTE) SARS-CoV-2 target nucleic acids  are NOT DETECTED.  The SARS-CoV-2 RNA is generally detectable in upper respiratory specimens during the acute phase of infection. The lowest concentration of SARS-CoV-2  viral copies this assay can detect is 138 copies/mL. A negative result does not preclude SARS-Cov-2 infection and should not be used as the sole basis for treatment or other patient management decisions. A negative result may occur with  improper specimen collection/handling, submission of specimen other than nasopharyngeal swab, presence of viral mutation(s) within the areas targeted by this assay, and inadequate number of viral copies(<138 copies/mL). A negative result must be combined with clinical observations, patient history, and epidemiological information. The expected result is Negative.  Fact Sheet for Patients:  BloggerCourse.com  Fact Sheet for Healthcare Providers:  SeriousBroker.it  This test is no t yet approved or cleared by the [place] FDA and  has been authorized for detection and/or diagnosis of SARS-CoV-2 by FDA under an Emergency Use Authorization (EUA). This EUA will remain  in effect (meaning this test can be used) for the duration of the COVID-19 declaration under Section 564(b)(1) of the Act, 21 U.S.C.section 360bbb-3(b)(1), unless the authorization is terminated  or revoked sooner.       Influenza A by PCR NEGATIVE NEGATIVE   Influenza B by PCR NEGATIVE NEGATIVE    Comment: (NOTE) The Xpert Xpress SARS-CoV-2/FLU/RSV plus assay is intended as an aid in the diagnosis of influenza from Nasopharyngeal swab specimens and should not be used as a sole basis for treatment. Nasal washings and aspirates are unacceptable for Xpert Xpress SARS-CoV-2/FLU/RSV testing.  Fact Sheet for Patients: BloggerCourse.com  Fact Sheet for Healthcare Providers: SeriousBroker.it  This test is not yet  approved or cleared by the [place] FDA and has been authorized for detection and/or diagnosis of SARS-CoV-2 by FDA under an Emergency Use Authorization (EUA). This EUA will remain in effect (meaning this test can be used) for the duration of the COVID-19 declaration under Section 564(b)(1) of the Act, 21 U.S.C. section 360bbb-3(b)(1), unless the authorization is terminated or revoked.  Performed at [facility], [address] [place], [place] [address]    CT ABDOMEN PELVIS W CONTRAST  Result Date: 11/03/2020 CLINICAL DATA:  Diffuse abdominal pain. EXAM: CT ABDOMEN AND PELVIS WITH CONTRAST TECHNIQUE: Multidetector CT imaging of the abdomen and pelvis was performed using the standard protocol following bolus administration of intravenous contrast. CONTRAST:  44mL OMNIPAQUE IOHEXOL 350 MG/ML SOLN COMPARISON:  None. FINDINGS: Lower chest: No acute abnormality. Hepatobiliary: No focal liver abnormality is seen. Subcentimeter gallstones are seen within the lumen of a moderately distended gallbladder. There is no evidence of gallbladder wall thickening, pericholecystic inflammation or biliary dilatation. Pancreas: Unremarkable. No pancreatic ductal dilatation or surrounding inflammatory changes. Spleen: Normal in size without focal abnormality. Adrenals/Urinary Tract: Adrenal glands are unremarkable. Kidneys are normal, without renal calculi, focal lesion, or hydronephrosis. Bladder is unremarkable. Stomach/Bowel: Stomach is within normal limits. Appendix appears normal. No evidence of bowel wall thickening, distention, or inflammatory changes. Vascular/Lymphatic: No significant vascular findings are present. No enlarged abdominal or pelvic lymph nodes. Reproductive: Status post hysterectomy. No adnexal masses. Other: No abdominal wall hernia or abnormality. No abdominopelvic ascites. Musculoskeletal: Degenerative changes are seen at the level of L4-L5. IMPRESSION: Cholelithiasis without  evidence of acute cholecystitis. Electronically Signed   By: Aram Candela M.D.   On: 11/03/2020 02:42   US Abdomen Limited RUQ (LIVER/GB)  Result Date: 11/03/2020 CLINICAL DATA:  Right upper quadrant pain, nausea, vomiting EXAM: ULTRASOUND ABDOMEN LIMITED RIGHT UPPER QUADRANT COMPARISON:  Same day CT abdomen/pelvis FINDINGS: Gallbladder: There are multiple shadowing gallstones in the gallbladder. There is gallbladder wall thickening measuring  up to 10 mm. The sonographer reports a positive sonographic Murphy's sign. Common bile duct: Diameter: 4 mm Liver: No focal lesion identified. Within normal limits in parenchymal echogenicity. Portal vein is patent on color Doppler imaging with normal direction of blood flow towards the liver. Other: None. IMPRESSION: Cholelithiasis with gallbladder wall thickening and positive sonographic Murphy's sign. Findings are consistent with acute cholecystitis. HIDA scan may be obtained to confirm, if needed. Electronically Signed   By: Lesia Hausen M.D.   On: 11/03/2020 08:55      Assessment/Plan Cholecystitis The patient's chart has been reviewed and she has been seen.  She has findings c/w cholecystitis.  We will plan to proceed with surgical intervention today as OR available. I have explained the procedure, risks, and aftercare of cholecystectomy.  Risks include but are not limited to bleeding, infection, wound problems, anesthesia, diarrhea, bile leak, injury to common bile duct/liver/intestine.  She seems to understand and agrees to proceed.   FEN - NPO/IVFs VTE - start tomorrow if here and pending hgb ID - Rocephin/Flagyl (for trich) Admit - obs  Trichomonas - flagyl 500mg  BID x 7 days Thalassemia - gets transfusions about every 6 months.  Last one 4 months ago.  Hbg 13.4 today.  Will Type and screen so we have blood if needed. 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

## 2020-11-04 ENCOUNTER — Encounter (HOSPITAL_COMMUNITY): Payer: Self-pay | Admitting: General Surgery

## 2020-11-04 LAB — CBC
HCT: 39.7 % (ref 36.0–46.0)
Hemoglobin: 12.3 g/dL (ref 12.0–15.0)
MCH: 24 pg — ABNORMAL LOW (ref 26.0–34.0)
MCHC: 31 g/dL (ref 30.0–36.0)
MCV: 77.5 fL — ABNORMAL LOW (ref 80.0–100.0)
Platelets: 203 10*3/uL (ref 150–400)
RBC: 5.12 MIL/uL — ABNORMAL HIGH (ref 3.87–5.11)
RDW: 15 % (ref 11.5–15.5)
WBC: 9.3 10*3/uL (ref 4.0–10.5)
nRBC: 0 % (ref 0.0–0.2)

## 2020-11-04 LAB — GC/CHLAMYDIA PROBE AMP (~~LOC~~) NOT AT ARMC
Chlamydia: NEGATIVE
Comment: NEGATIVE
Comment: NORMAL
Neisseria Gonorrhea: NEGATIVE

## 2020-11-04 LAB — HIV ANTIBODY (ROUTINE TESTING W REFLEX): HIV Screen 4th Generation wRfx: NONREACTIVE

## 2020-11-04 MED ORDER — OXYCODONE HCL 5 MG PO TABS: 5.0000 mg | ORAL_TABLET | ORAL | 0 refills | Status: AC | PRN

## 2020-11-04 NOTE — Discharge Summary (Signed)
Patient ID: Kaitlyn Ashley [id number] 08/14/1970 50 y.o.  Admit date: 11/03/2020 Discharge date: 11/04/2020  Admitting Diagnosis: cholecystitis  Discharge Diagnosis Patient Active Problem List   Diagnosis Date Noted   Acute cholecystitis 11/03/2020   Thalassemia    Hypertension    Morbid obesity with BMI of 40.0-44.9, adult [facility])     Consultants none  Reason for Admission: This is a 50 yo female with a history of obesity, HTN, and thalassemia who began having some upper abdominal pain about 3 days ago.  This radiates to her back .  She began having nausea and vomiting as well.  She denies any diarrhea.  She denies any fevers, chills, CP, SOB, cough, etc.  She does admit to some dysuria.  Due to persistent symptoms with no improvement, she went to [facility] for evaluation. She underwent labs with normal LFTs, lipase 64, and normal WBC.  She had an [place] that revealed cholelithiasis with gallbladder wall thickening and positive sonographic Murphy's sign.  Her UA reveals small hgb and some trichomonas present.  She was sent to [facility] for surgical evaluation.  Procedures Lap chole, Dr. Andrey Campanile 9/15  Hospital Course:  The patient was admitted and underwent a laparoscopic cholecystectomy.  The patient tolerated the procedure well.  On POD 1, the patient was tolerating a regular diet, voiding well, mobilizing, and pain was controlled with oral pain medications.  The patient was stable for DC home at this time with appropriate follow up made.   Physical Exam: Abd: soft, appropriately tender, +BS, incisions c/d/i  Allergies as of 11/04/2020       Reactions   Latex Itching   Penicillins    Tolerated Cephalosporin Date: 11/03/20.   Sulfa Antibiotics Itching   hives        Medication List     TAKE these medications    acetaminophen 325 MG tablet Commonly known as: TYLENOL Take 2 tablets (650 mg total) by mouth every 6 (six) hours as needed for mild pain (or Fever >/= 101).    albuterol 108 (90 Base) MCG/ACT inhaler Commonly known as: VENTOLIN HFA Inhale 2 puffs into the lungs every 6 (six) hours as needed for wheezing or shortness of breath.   atenolol-chlorthalidone 50-25 MG tablet Commonly known as: TENORETIC Take 1 tablet by mouth daily.   doxycycline 100 MG capsule Commonly known as: VIBRAMYCIN Take 1 capsule (100 mg total) by mouth 2 (two) times daily for 10 days.   gabapentin 600 MG tablet Commonly known as: NEURONTIN Take 600 mg by mouth 2 (two) times daily as needed for pain.   ibuprofen 200 MG tablet Commonly known as: ADVIL Take 800 mg by mouth every 6 (six) hours as needed for mild pain.   metroNIDAZOLE 500 MG tablet Commonly known as: FLAGYL Take 1 tablet (500 mg total) by mouth 2 (two) times daily for 7 days.   oxyCODONE 5 MG immediate release tablet Commonly known as: Oxy IR/ROXICODONE Take 1 tablet (5 mg total) by mouth every 4 (four) hours as needed for moderate pain.          Follow-up Information     Sur[facility], [place] Follow up in 3 week(s).   Specialty: General Surgery Why: our office is working on your appointment.  please call to confirm if you have not heard from them in several days.  please arrive 30 minutes prior to your appointment time for paperwork and check in process Contact information: [address]  [place] [address] [phone]                 Signed: Barnetta Chapel, [facility] 11/04/2020, 9:58 AM Please see Amion for pager number during day hours 7:00am-4:30pm, 7-11:30am on Weekends

## 2020-11-04 NOTE — Progress Notes (Signed)
Discharge instructions discussed with patient and family, verbalized agreement and understanding 

## 2020-11-07 ENCOUNTER — Other Ambulatory Visit: Payer: Self-pay | Admitting: Physician Assistant

## 2020-11-07 DIAGNOSIS — Z1231 Encounter for screening mammogram for malignant neoplasm of breast: Secondary | ICD-10-CM

## 2020-11-07 LAB — SURGICAL PATHOLOGY

## 2020-11-10 DIAGNOSIS — Z9049 Acquired absence of other specified parts of digestive tract: Secondary | ICD-10-CM | POA: Diagnosis not present

## 2020-11-10 DIAGNOSIS — F419 Anxiety disorder, unspecified: Secondary | ICD-10-CM | POA: Diagnosis not present

## 2020-11-10 DIAGNOSIS — I10 Essential (primary) hypertension: Secondary | ICD-10-CM | POA: Diagnosis not present

## 2020-12-10 DIAGNOSIS — R030 Elevated blood-pressure reading, without diagnosis of hypertension: Secondary | ICD-10-CM | POA: Diagnosis not present

## 2020-12-10 DIAGNOSIS — R5383 Other fatigue: Secondary | ICD-10-CM | POA: Diagnosis not present

## 2020-12-10 DIAGNOSIS — Z1339 Encounter for screening examination for other mental health and behavioral disorders: Secondary | ICD-10-CM | POA: Diagnosis not present

## 2020-12-10 DIAGNOSIS — R7303 Prediabetes: Secondary | ICD-10-CM | POA: Diagnosis not present

## 2020-12-10 DIAGNOSIS — Z1159 Encounter for screening for other viral diseases: Secondary | ICD-10-CM | POA: Diagnosis not present

## 2020-12-10 DIAGNOSIS — Z79899 Other long term (current) drug therapy: Secondary | ICD-10-CM | POA: Diagnosis not present

## 2020-12-10 DIAGNOSIS — Z23 Encounter for immunization: Secondary | ICD-10-CM | POA: Diagnosis not present

## 2020-12-10 DIAGNOSIS — Z114 Encounter for screening for human immunodeficiency virus [HIV]: Secondary | ICD-10-CM | POA: Diagnosis not present

## 2020-12-10 DIAGNOSIS — E559 Vitamin D deficiency, unspecified: Secondary | ICD-10-CM | POA: Diagnosis not present

## 2020-12-10 DIAGNOSIS — Z6838 Body mass index (BMI) 38.0-38.9, adult: Secondary | ICD-10-CM | POA: Diagnosis not present

## 2020-12-10 DIAGNOSIS — Z0000 Encounter for general adult medical examination without abnormal findings: Secondary | ICD-10-CM | POA: Diagnosis not present

## 2020-12-19 ENCOUNTER — Ambulatory Visit
Admission: RE | Admit: 2020-12-19 | Discharge: 2020-12-19 | Disposition: A | Discharge status: Home / Self Care | Payer: BC Managed Care – PPO | Source: Ambulatory Visit | Attending: Physician Assistant | Admitting: Physician Assistant

## 2020-12-19 ENCOUNTER — Other Ambulatory Visit: Payer: Self-pay

## 2020-12-19 DIAGNOSIS — Z1231 Encounter for screening mammogram for malignant neoplasm of breast: Secondary | ICD-10-CM

## 2021-08-17 IMAGING — MG DIGITAL DIAGNOSTIC BILAT W/ TOMO W/ CAD
6 of 10 series · 6 of 30 positions shown · non-contrast
Comparison: Previous exam(s).

CLINICAL DATA: 48-year-old female with bilateral breast swelling,
LEFT-greater-than-RIGHT, primarily in the LEFT axillary tail.
History of bilateral breast reductions

EXAM:
DIGITAL DIAGNOSTIC BILATERAL MAMMOGRAM WITH CAD AND TOMO
ULTRASOUND LEFT BREAST

[L TAN synth-2D]
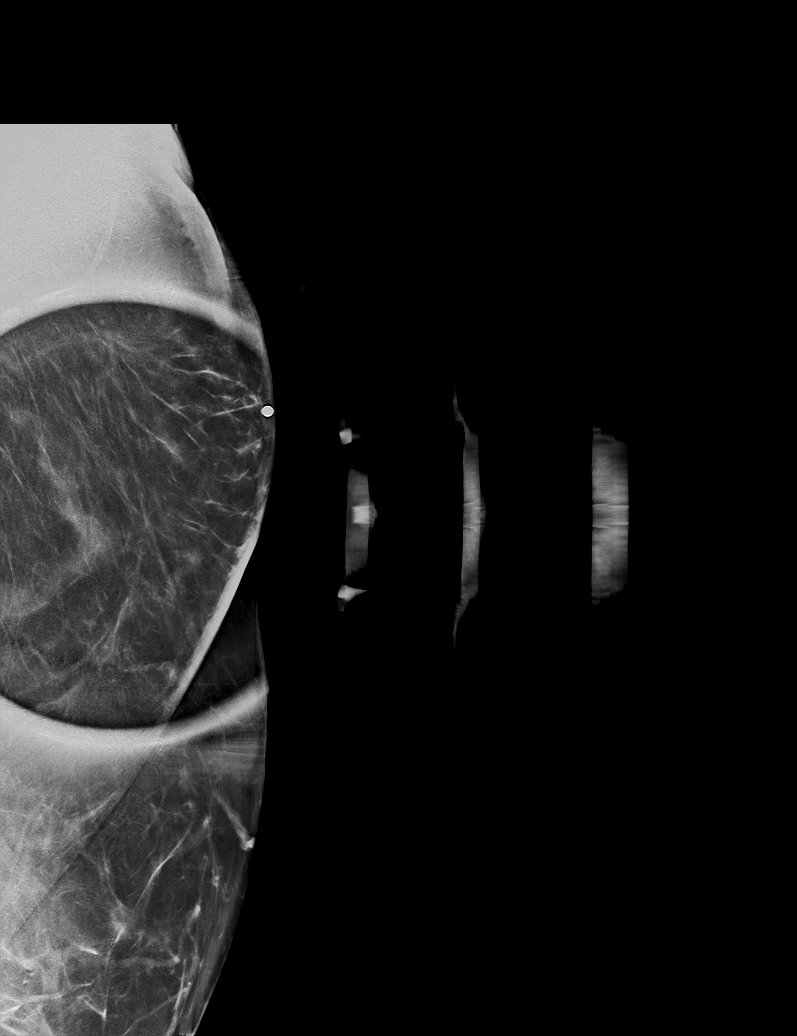

[R MLO synth-2D]
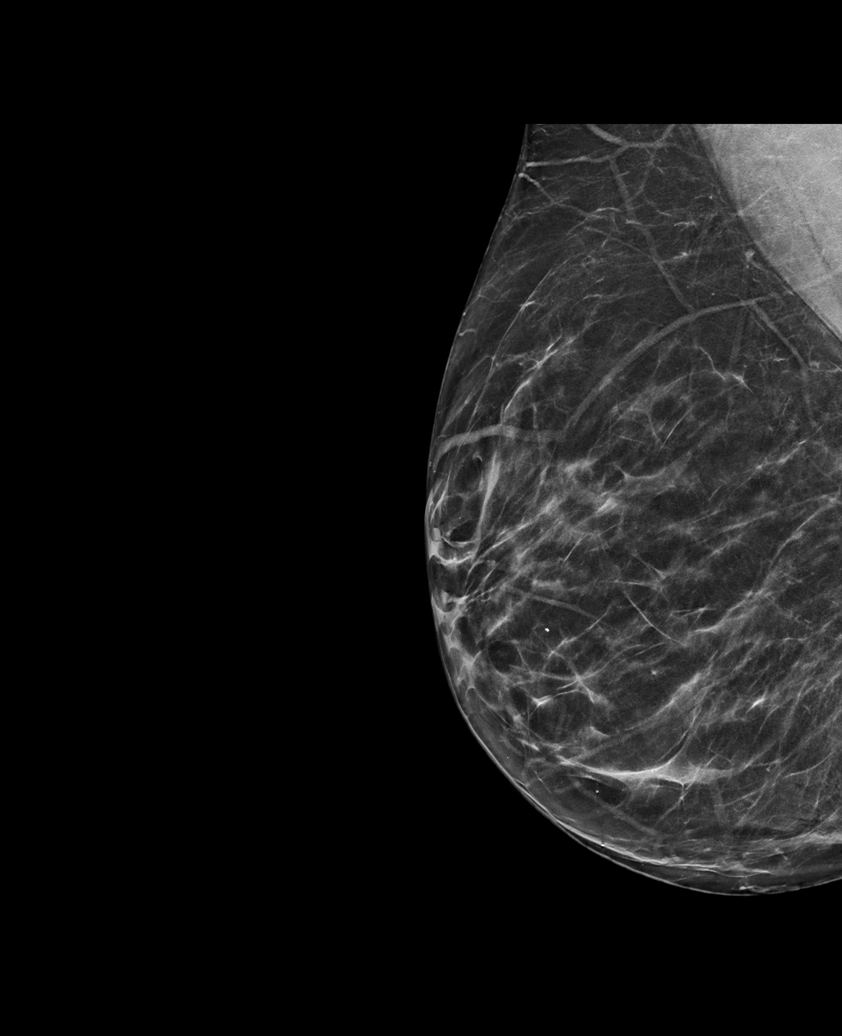

[R CC synth-2D]
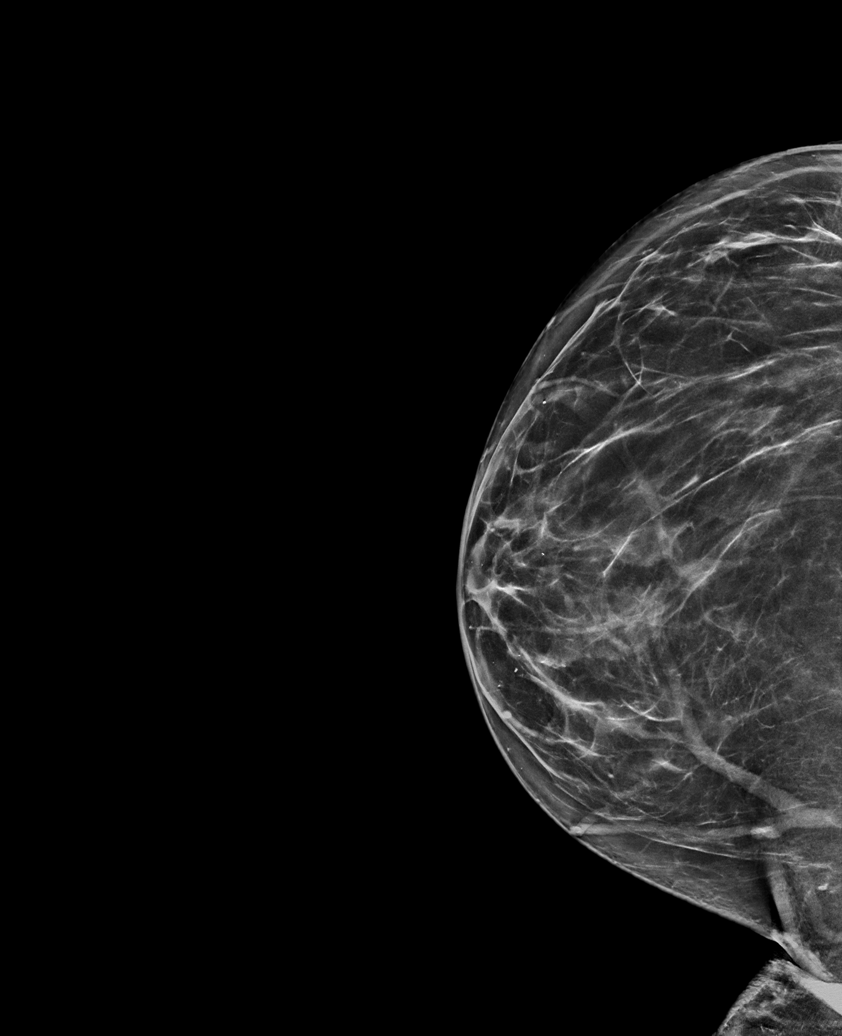

[L CC synth-2D]
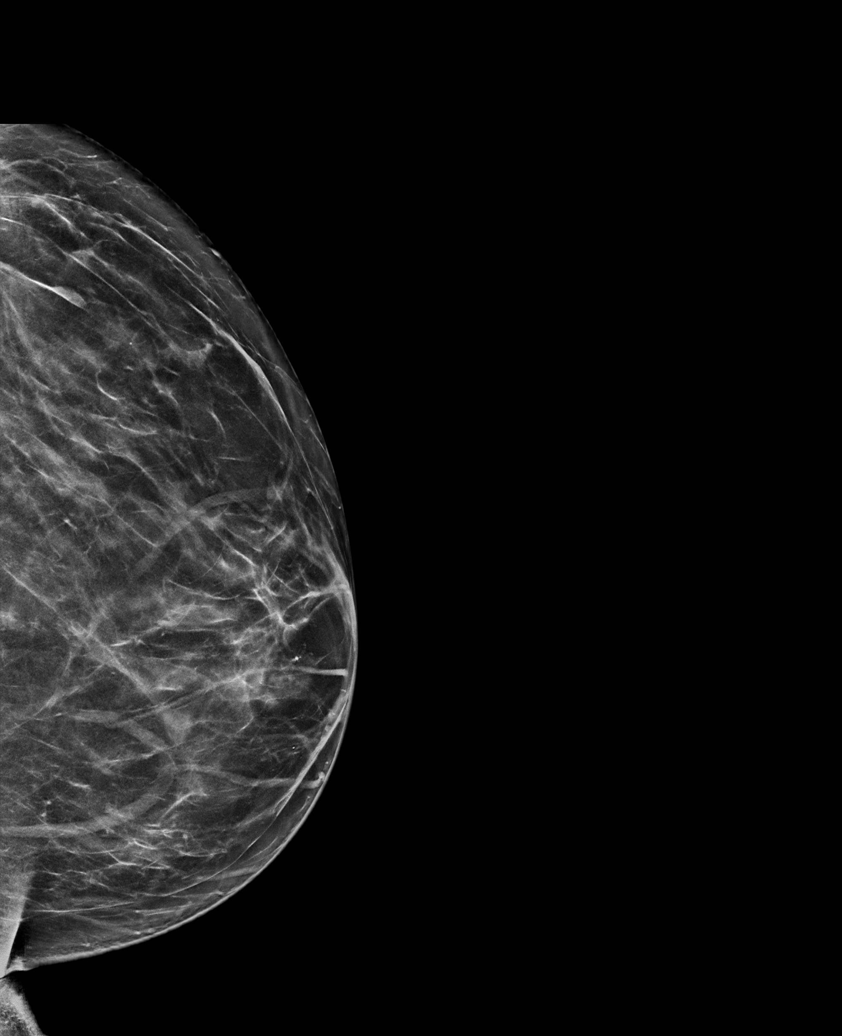

[L MLO synth-2D]
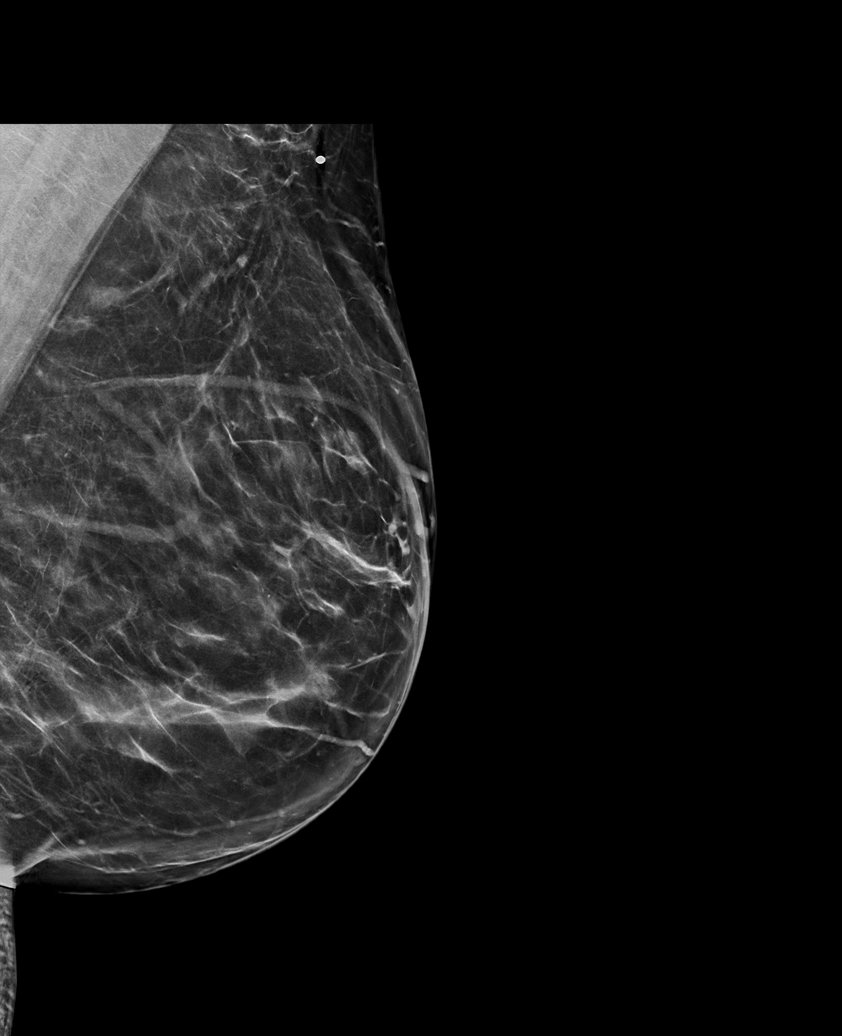

[L CC tomo · tomo slice 36/71.0]
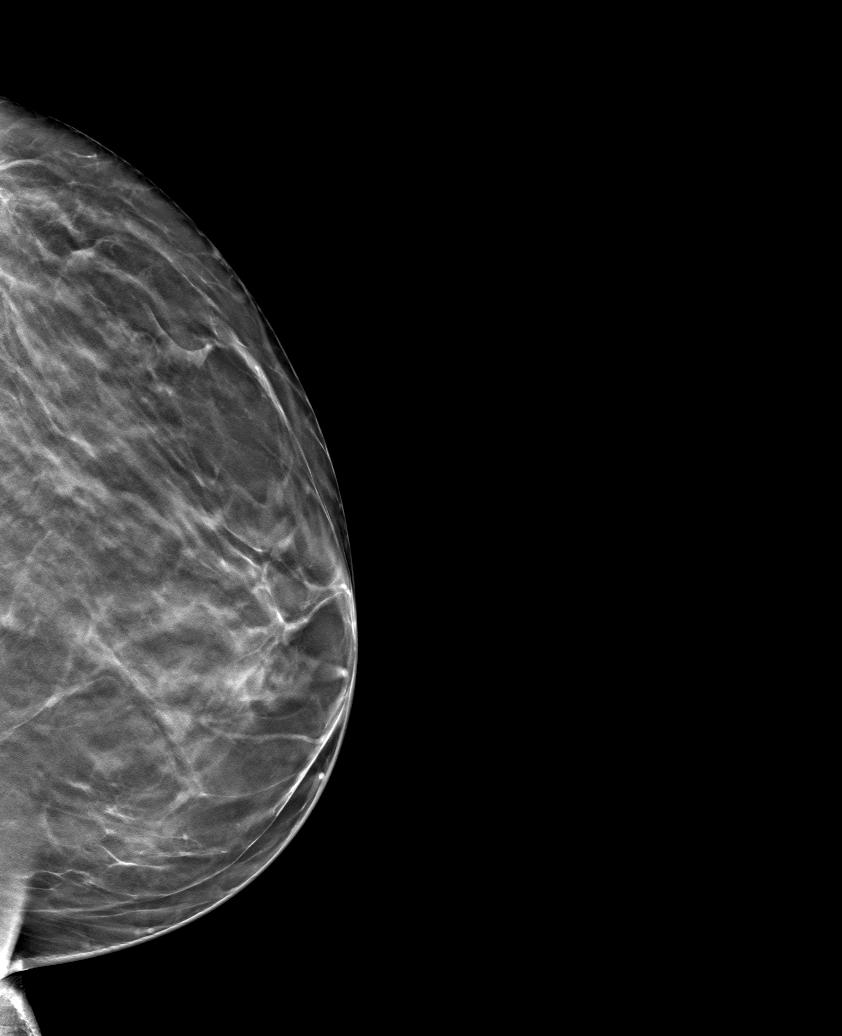

[6 of 30 positions shown; findings below may reference images not displayed]

ACR Breast Density Category b: There are scattered areas of
fibroglandular density.
FINDINGS: 2D/3D full field views of both breasts demonstrate no suspicious
mass, nonsurgical distortion or worrisome calcifications.

Mammographic images were processed with CAD.

On physical exam, minimal thickening identified in the LOWER LEFT
axilla without discrete palpable mass.

Targeted ultrasound is performed, showing no solid or cystic mass,
distortion or worrisome shadowing within the LOWER LEFT axilla or in
the UPPER-OUTER LEFT breast, in the areas of patient concern. No
abnormal LEFT axillary lymph nodes are noted
IMPRESSION: 1. No mammographic, suspicious palpable or sonographic abnormality
with LOWER LEFT axilla or UPPER-OUTER LEFT breast, in the areas of
patient concern.
2. No mammographic evidence of breast malignancy.

RECOMMENDATION:
Bilateral screening mammogram in 1 year.

Clinical follow-up recommended for the area of concern in the LEFT
breast. Any further workup should be based on clinical grounds.

I have discussed the findings and recommendations with the patient.
If applicable, a reminder letter will be sent to the patient
regarding the next appointment.

BI-RADS CATEGORY  1: Negative.

## 2021-11-21 ENCOUNTER — Other Ambulatory Visit: Payer: Self-pay | Admitting: Physician Assistant

## 2021-11-21 DIAGNOSIS — Z1231 Encounter for screening mammogram for malignant neoplasm of breast: Secondary | ICD-10-CM

## 2021-12-20 ENCOUNTER — Ambulatory Visit
Admission: RE | Admit: 2021-12-20 | Discharge: 2021-12-20 | Disposition: A | Discharge status: Home / Self Care | Payer: BC Managed Care – PPO | Source: Ambulatory Visit | Attending: Physician Assistant | Admitting: Physician Assistant

## 2021-12-20 DIAGNOSIS — Z1231 Encounter for screening mammogram for malignant neoplasm of breast: Secondary | ICD-10-CM

## 2022-09-29 ENCOUNTER — Emergency Department (HOSPITAL_BASED_OUTPATIENT_CLINIC_OR_DEPARTMENT_OTHER): Payer: BC Managed Care – PPO

## 2022-09-29 ENCOUNTER — Encounter (HOSPITAL_BASED_OUTPATIENT_CLINIC_OR_DEPARTMENT_OTHER): Payer: Self-pay

## 2022-09-29 ENCOUNTER — Other Ambulatory Visit: Payer: Self-pay

## 2022-09-29 ENCOUNTER — Emergency Department (HOSPITAL_BASED_OUTPATIENT_CLINIC_OR_DEPARTMENT_OTHER): Admission: EM | Admit: 2022-09-29 | Payer: BC Managed Care – PPO | Source: Home / Self Care

## 2022-09-29 DIAGNOSIS — M109 Gout, unspecified: Secondary | ICD-10-CM | POA: Insufficient documentation

## 2022-09-29 DIAGNOSIS — M79672 Pain in left foot: Secondary | ICD-10-CM | POA: Diagnosis present

## 2022-09-29 DIAGNOSIS — Z91040 Latex allergy status: Secondary | ICD-10-CM | POA: Diagnosis not present

## 2022-09-29 DIAGNOSIS — E876 Hypokalemia: Secondary | ICD-10-CM | POA: Insufficient documentation

## 2022-09-29 HISTORY — DX: Guillain-Barre syndrome: G61.0

## 2022-09-29 LAB — CBC WITH DIFFERENTIAL/PLATELET
Abs Immature Granulocytes: 0.03 10*3/uL (ref 0.00–0.07)
Basophils Absolute: 0 10*3/uL (ref 0.0–0.1)
Basophils Relative: 0 %
Eosinophils Absolute: 0.1 10*3/uL (ref 0.0–0.5)
Eosinophils Relative: 1 %
HCT: 43.5 % (ref 36.0–46.0)
Hemoglobin: 13.4 g/dL (ref 12.0–15.0)
Immature Granulocytes: 0 %
Lymphocytes Relative: 33 %
Lymphs Abs: 2.9 10*3/uL (ref 0.7–4.0)
MCH: 24.1 pg — ABNORMAL LOW (ref 26.0–34.0)
MCHC: 30.8 g/dL (ref 30.0–36.0)
MCV: 78.2 fL — ABNORMAL LOW (ref 80.0–100.0)
Monocytes Absolute: 0.5 10*3/uL (ref 0.1–1.0)
Monocytes Relative: 6 %
Neutro Abs: 5.1 10*3/uL (ref 1.7–7.7)
Neutrophils Relative %: 60 %
Platelets: 254 10*3/uL (ref 150–400)
RBC: 5.56 MIL/uL — ABNORMAL HIGH (ref 3.87–5.11)
RDW: 14.8 % (ref 11.5–15.5)
WBC: 8.6 10*3/uL (ref 4.0–10.5)
nRBC: 0 % (ref 0.0–0.2)

## 2022-09-29 LAB — COMPREHENSIVE METABOLIC PANEL
ALT: 12 U/L (ref 0–44)
AST: 12 U/L — ABNORMAL LOW (ref 15–41)
Albumin: 3.7 g/dL (ref 3.5–5.0)
Alkaline Phosphatase: 85 U/L (ref 38–126)
Anion gap: 13 (ref 5–15)
BUN: 12 mg/dL (ref 6–20)
CO2: 30 mmol/L (ref 22–32)
Calcium: 9 mg/dL (ref 8.9–10.3)
Chloride: 95 mmol/L — ABNORMAL LOW (ref 98–111)
Creatinine, Ser: 0.94 mg/dL (ref 0.44–1.00)
GFR, Estimated: >60 mL/min (ref >60)
Glucose, Bld: 101 mg/dL — ABNORMAL HIGH (ref 70–99)
Potassium: 2.6 mmol/L — CL (ref 3.5–5.1)
Sodium: 138 mmol/L (ref 135–145)
Total Bilirubin: 0.4 mg/dL (ref 0.3–1.2)
Total Protein: 7.9 g/dL (ref 6.5–8.1)

## 2022-09-29 LAB — URIC ACID: Uric Acid, Serum: 7.4 mg/dL — ABNORMAL HIGH (ref 2.5–7.1)

## 2022-09-29 LAB — MAGNESIUM: Magnesium: 2 mg/dL (ref 1.7–2.4)

## 2022-09-29 MED ORDER — MORPHINE SULFATE (PF) 4 MG/ML IV SOLN
4.0000 mg | Freq: Once | INTRAVENOUS | Status: AC
  Administered 2022-09-29: 4 mg via INTRAVENOUS
  Filled 2022-09-29: qty 1

## 2022-09-29 MED ORDER — SODIUM CHLORIDE 0.9 % IV SOLN: Freq: Once | INTRAVENOUS | Status: AC

## 2022-09-29 MED ORDER — COLCHICINE 0.6 MG PO TABS: 0.6000 mg | ORAL_TABLET | Freq: Every day | ORAL | 0 refills | Status: AC

## 2022-09-29 MED ORDER — POTASSIUM CHLORIDE CRYS ER 20 MEQ PO TBCR
40.0000 meq | EXTENDED_RELEASE_TABLET | Freq: Once | ORAL | Status: AC
  Administered 2022-09-29: 40 meq via ORAL
  Filled 2022-09-29: qty 2

## 2022-09-29 MED ORDER — POTASSIUM CHLORIDE 10 MEQ/100ML IV SOLN
10.0000 meq | INTRAVENOUS | Status: AC
  Administered 2022-09-29 – 2022-09-30 (×3): 10 meq via INTRAVENOUS
  Filled 2022-09-29 (×3): qty 100

## 2022-09-29 MED ORDER — OXYCODONE-ACETAMINOPHEN 5-325 MG PO TABS: 1.0000 | ORAL_TABLET | Freq: Four times a day (QID) | ORAL | 0 refills | Status: AC | PRN

## 2022-09-29 NOTE — ED Notes (Signed)
Notified EDP, and primary RN of serum potassium 2.6.

## 2022-09-29 NOTE — Discharge Instructions (Addendum)
You have gout.  I have prescribed colchicine that you should take daily.  If you have persistent pain you can take Percocet as prescribed.  Your potassium is low and I recommend you continue taking her potassium tablets as prescribed by your doctor.  You need to follow-up with your doctor and recheck your potassium level next week  You should also call orthopedic doctor office on Monday for appointment  Return to ER if you have worsening foot pain, fever, unable to walk

## 2022-09-29 NOTE — ED Notes (Signed)
Lab notified of add on Magnesium.

## 2022-09-29 NOTE — ED Provider Notes (Signed)
[place] [facility] Provider Note   CSN: [id number] Arrival date & time: 09/29/22  1743     History  Chief Complaint  Patient presents with   Foot Swelling    Kaitlyn Ashley is a 52 y.o. 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  Patient states that she has been having left foot pain and swelling for the last 2 weeks or so.  She saw her primary care doctor and was told that she have gout.  She finished 2 courses of prednisone with no improvement.  She was referred to Ortho but has not seen them.  Patient has history of Guillain-Barr but denies ascending weakness.  She states that she has some numbness of the foot but no weakness.  Patient states that she is able to ambulate by herself and has no numbness or weakness of the hands.  She has no previous history of gout.  The history is provided by the patient.       Home Medications Prior to Admission medications   Medication Sig Start Date End Date Taking? Authorizing Provider  acetaminophen (TYLENOL) 325 MG tablet Take 2 tablets (650 mg total) by mouth every 6 (six) hours as needed for mild pain (or Fever >/= 101). 06/13/19   Lonia Blood, MD  albuterol (VENTOLIN HFA) 108 (90 Base) MCG/ACT inhaler Inhale 2 puffs into the lungs every 6 (six) hours as needed for wheezing or shortness of breath. 09/22/20   [provider]  atenolol-chlorthalidone (TENORETIC) 50-25 MG tablet Take 1 tablet by mouth daily. 10/27/20   [provider]  gabapentin (NEURONTIN) 600 MG tablet Take 600 mg by mouth 2 (two) times daily as needed for pain. 05/14/20   [provider]  ibuprofen (ADVIL) 200 MG tablet Take 800 mg by mouth every 6 (six) hours as needed for mild pain.    [provider]  oxyCODONE (OXY IR/ROXICODONE) 5 MG immediate release tablet Take 1 tablet (5 mg total) by mouth every 4 (four) hours as needed for moderate pain.  11/04/20   Barnetta Chapel, PA-C      Allergies    Latex, Penicillins, and Sulfa antibiotics    Review of Systems   Review of Systems  Musculoskeletal:        Left foot pain  All other systems reviewed and are negative.   Physical Exam Updated Vital Signs BP 139/87 (BP Location: Left Arm)   Pulse 67   Temp 98.3 F (36.8 C) (Oral)   Resp 16   Ht 5\' 5"  (1.651 m)   Wt 101 kg   SpO2 100%   BMI 37.05 kg/m  Physical Exam Vitals and nursing note reviewed.  Constitutional:      Comments: Uncomfortable  HENT:     Head: Normocephalic.     Nose: Nose normal.     Mouth/Throat:     Mouth: Mucous membranes are moist.  Eyes:     Extraocular Movements: Extraocular movements intact.     Conjunctiva/sclera: Conjunctivae normal.     Pupils: Pupils are equal, round, and reactive to light.  Cardiovascular:     Rate and Rhythm: Normal rate and regular rhythm.     Pulses: Normal pulses.     Heart sounds: Normal heart sounds.  Pulmonary:     Effort: Pulmonary effort is normal.     Breath sounds: Normal breath sounds.  Abdominal:     General: Abdomen is flat.  Palpations: Abdomen is soft.  Musculoskeletal:     Cervical back: Normal range of motion and neck supple.     Comments: Left foot slightly swollen.  In particular there is swelling to the left first MTP joint.  No obvious septic joint.  Skin:    General: Skin is warm.     Capillary Refill: Capillary refill takes less than 2 seconds.  Neurological:     General: No focal deficit present.     Mental Status: She is oriented to person, place, and time.     Comments: Patient has slightly decreased sensation in the left foot.  However patient has good reflexes bilateral knees.  Patient has normal strength knee flexion and extension bilaterally  Psychiatric:        Mood and Affect: Mood normal.        Behavior: Behavior normal.     ED Results / Procedures / Treatments   Labs (all labs ordered are listed, but only abnormal  results are displayed) Labs Reviewed  CBC WITH DIFFERENTIAL/PLATELET  COMPREHENSIVE METABOLIC PANEL  URIC ACID    EKG None  Radiology DG Foot Complete Left  Result Date: 09/29/2022 CLINICAL DATA:  Left foot pain and swelling, initial encounter EXAM: LEFT FOOT - COMPLETE 3+ VIEW COMPARISON:  None Available. FINDINGS: Mild calcaneal spurring is noted. No acute fracture or dislocation is seen. Soft tissue swelling is noted about the metatarsals. No other focal abnormality is noted. IMPRESSION: Soft tissue swelling without acute bony abnormality. Electronically Signed   By: Alcide Clever M.D.   On: 09/29/2022 20:54    Procedures Procedures    Medications Ordered in ED Medications  morphine (PF) 4 MG/ML injection 4 mg (4 mg Intravenous Given 09/29/22 2057)    ED Course/ Medical Decision Making/ A&P                                 Medical Decision Making Kaitlyn Ashley is a 52 y.o. female here with left foot pain and swelling.  I think still likely she has gout.  There is no signs of septic joint.  Will get x-rays and will get CBC and CMP and uric acid level.  Patient does have history of Guillain-Barr but I do not think she had another episode of Guillain-Barr.  11:22 PM Reviewed patient's labs and patient's potassium is 2.6.  Patient states that she is prescribed potassium but missed some pills.  Her uric acid is also elevated at 7.4.  Her foot x-ray is unremarkable.  I think clinically she does have gout.  Since her kidney function is normal, I we will prescribe colchicine.  Will also prescribe short course of pain medicine.  She also received IV potassium in the ED.  Will refer to Ortho outpatient.  Problems Addressed: Acute gout involving toe of left foot, unspecified cause: acute illness or injury Hypokalemia: acute illness or injury  Amount and/or Complexity of Data Reviewed Labs: ordered. Decision-making details documented in ED Course. Radiology: ordered and independent  interpretation performed. Decision-making details documented in ED Course.  Risk Prescription drug management.    Final Clinical Impression(s) / ED Diagnoses Final diagnoses:  None    Rx / DC Orders ED Discharge Orders     None         Charlynne Pander, MD 09/29/22 2323

## 2022-09-29 NOTE — ED Triage Notes (Signed)
The patient has had swelling to her left foot. She has been seen by her PCP for same. They have tried two course of steroids and gout medication and that has not helped.  She stated sometimes her foot turns purplish red color. The last time was last night.

## 2022-10-09 ENCOUNTER — Ambulatory Visit: Payer: BC Managed Care – PPO | Admitting: Podiatry

## 2022-10-12 ENCOUNTER — Other Ambulatory Visit: Payer: Self-pay | Admitting: Physician Assistant

## 2022-10-12 DIAGNOSIS — Z1231 Encounter for screening mammogram for malignant neoplasm of breast: Secondary | ICD-10-CM

## 2022-10-18 DIAGNOSIS — Z1231 Encounter for screening mammogram for malignant neoplasm of breast: Secondary | ICD-10-CM

## 2022-12-24 ENCOUNTER — Ambulatory Visit: Payer: BC Managed Care – PPO

## 2022-12-24 ENCOUNTER — Ambulatory Visit
Admission: RE | Admit: 2022-12-24 | Discharge: 2022-12-24 | Disposition: A | Discharge status: Home / Self Care | Payer: BC Managed Care – PPO | Source: Ambulatory Visit | Attending: Physician Assistant | Admitting: Physician Assistant

## 2022-12-24 DIAGNOSIS — Z1231 Encounter for screening mammogram for malignant neoplasm of breast: Secondary | ICD-10-CM

## 2023-10-04 ENCOUNTER — Other Ambulatory Visit: Payer: Self-pay | Admitting: Physician Assistant

## 2023-10-04 DIAGNOSIS — Z1231 Encounter for screening mammogram for malignant neoplasm of breast: Secondary | ICD-10-CM

## 2024-01-21 ENCOUNTER — Ambulatory Visit
